# Patient Record
Sex: Female | Born: 1990 | Race: White | Hispanic: No | Marital: Single | Smoking: Former smoker
Health system: Southern US, Community
[De-identification: ages and names within clinical notes are randomized; demographics above are authoritative.]

## PROBLEM LIST (undated history)

## (undated) DIAGNOSIS — F319 Bipolar disorder, unspecified: Secondary | ICD-10-CM

## (undated) DIAGNOSIS — E109 Type 1 diabetes mellitus without complications: Secondary | ICD-10-CM

## (undated) DIAGNOSIS — F1120 Opioid dependence, uncomplicated: Secondary | ICD-10-CM

## (undated) HISTORY — DX: Type 1 diabetes mellitus without complications: E10.9

## (undated) HISTORY — DX: Opioid dependence, uncomplicated: F11.20

## (undated) HISTORY — DX: Bipolar disorder, unspecified: F31.9

---
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
## 2009-05-30 ENCOUNTER — Emergency Department (HOSPITAL_BASED_OUTPATIENT_CLINIC_OR_DEPARTMENT_OTHER): Admission: EM | Admit: 2009-05-30 | Discharge: 2009-05-30 | Payer: Self-pay | Admitting: Emergency Medicine

## 2009-09-16 ENCOUNTER — Other Ambulatory Visit: Admission: RE | Admit: 2009-09-16 | Discharge: 2009-09-16 | Payer: Self-pay | Admitting: Obstetrics and Gynecology

## 2009-10-15 ENCOUNTER — Ambulatory Visit (HOSPITAL_COMMUNITY): Admission: RE | Admit: 2009-10-15 | Discharge: 2009-10-15 | Payer: Self-pay | Admitting: Obstetrics and Gynecology

## 2009-10-15 ENCOUNTER — Encounter: Admission: RE | Admit: 2009-10-15 | Discharge: 2009-11-26 | Payer: Self-pay | Admitting: Obstetrics and Gynecology

## 2010-02-23 ENCOUNTER — Ambulatory Visit (HOSPITAL_COMMUNITY)
Admission: RE | Admit: 2010-02-23 | Discharge: 2010-02-23 | Payer: Self-pay | Source: Home / Self Care | Attending: Obstetrics and Gynecology | Admitting: Obstetrics and Gynecology

## 2010-03-25 ENCOUNTER — Inpatient Hospital Stay (HOSPITAL_COMMUNITY)
Admission: AD | Admit: 2010-03-25 | Discharge: 2010-03-28 | Payer: Self-pay | Source: Home / Self Care | Attending: Obstetrics and Gynecology | Admitting: Obstetrics and Gynecology

## 2010-03-26 LAB — CBC
Hemoglobin: 14.4 g/dL (ref 12.0–15.0)
MCH: 30.5 pg (ref 26.0–34.0)
MCHC: 35.5 g/dL (ref 30.0–36.0)
RBC: 4.72 MIL/uL (ref 3.87–5.11)

## 2010-03-26 LAB — RPR: RPR Ser Ql: NONREACTIVE

## 2010-03-26 LAB — GLUCOSE, CAPILLARY
Glucose-Capillary: 67 mg/dL — ABNORMAL LOW (ref 70–99)
Glucose-Capillary: 101 mg/dL — ABNORMAL HIGH (ref 70–99)
Glucose-Capillary: 95 mg/dL (ref 70–99)

## 2010-03-27 LAB — CBC
HCT: 30.3 % — ABNORMAL LOW (ref 36.0–46.0)
Hemoglobin: 10.6 g/dL — ABNORMAL LOW (ref 12.0–15.0)
MCH: 30.5 pg (ref 26.0–34.0)
MCHC: 35 g/dL (ref 30.0–36.0)
MCV: 87.1 fL (ref 78.0–100.0)
Platelets: 105 10*3/uL — ABNORMAL LOW (ref 150–400)
RDW: 12.2 % (ref 11.5–15.5)

## 2010-03-27 LAB — GLUCOSE, CAPILLARY
Glucose-Capillary: 31 mg/dL — CL (ref 70–99)
Glucose-Capillary: 120 mg/dL — ABNORMAL HIGH (ref 70–99)
Glucose-Capillary: 189 mg/dL — ABNORMAL HIGH (ref 70–99)

## 2010-03-28 LAB — GLUCOSE, CAPILLARY
Glucose-Capillary: 102 mg/dL — ABNORMAL HIGH (ref 70–99)
Glucose-Capillary: 108 mg/dL — ABNORMAL HIGH (ref 70–99)
Glucose-Capillary: 68 mg/dL — ABNORMAL LOW (ref 70–99)

## 2010-04-05 NOTE — Op Note (Signed)
NAMEALIENA, GHRIST NO.:  [id number]  MEDICAL RECORD NO.:  [id number]         PATIENT TYPE:  WINP  LOCATION:                                FACILITY:  [facility]  PHYSICIAN:  Patsy Baltimore, MD     DATE OF BIRTH:  1990-10-05  DATE OF PROCEDURE:  03/26/2010 DATE OF DISCHARGE:                              OPERATIVE REPORT   PREOPERATIVE DIAGNOSIS:  Nonreassuring fetal heart tracing, amnionitis, and failure to progress.  POSTOPERATIVE DIAGNOSIS:  Nonreassuring fetal heart tracing, amnionitis, and failure to progress.  PROCEDURE PERFORMED:  Primary low transverse cesarean section.  SURGEON:  Patsy Baltimore, MD  ASSISTANT:  Tilda Burrow, MD  ANESTHESIA:  Epidural.  FINDINGS:  Occiput posterior position, full-term live female child weighing 7 pounds.  SPECIMEN:  Placenta.  ESTIMATED BLOOD LOSS:  900 mL.  COMPLICATIONS:  None.  Informed consent was obtained from the patient, and she was transferred to the operating room.  She had an epidural which had been placed during labor and this was bolused for the cesarean section.  She was placed in the dorsal supine position with a leftward tilt.  She was prepped and draped in the usual sterile fashion.  A Foley catheter was already in place.  A time-out was called and we began the procedure.  A Pfannenstiel skin incision was made and carried down to the underlying fascia with the Bovie.  The Bovie was nicked in the midline, and she was extended bilaterally.  The superior aspect of the fascial flap was grasped on either side with two Kocher clamps and the underlying rectus muscle was dissected off.  The same was done inferiorly down to the pubic bone.  The rectus muscle was then divided in the midline and the peritoneum was entered by blunt separation.  The peritoneal entry was stretched superiorly to avoid the bladder.  The bands of peritoneum were then taken down laterally to increase the peritoneal entry.  An  Alexis O retractor was then put in place.  The bladder blade was created at the vesicouterine reflection with the Metzenbaum scissors and developed digitally.  The bladder was then pushed down with the bladder blade. The uterine incision was made with the scalpel.  The infant's head was then delivered atraumatically, and the rest of the body with fundal pressure.  The nose and mouth were suctioned.  Cord was clamped and cut and the baby was handed off to the waiting pediatrician.  The patient was a cord blood donor, so the cord blood collection was done.  The placenta was then delivered by manual extraction.  The uterus was cleared of all clots and debris.  The uterine closure was done using 0 chromic in a single layer.  Reinforcement figure-of-eight stitches were placed to accomplish hemostasis.  Once hemostasis was achieved, the Alexis O retractor was then removed.  The paracolic gutters were cleared of blood clots.  The peritoneum was closed with 0 Vicryl and the fascia was closed with 0 Vicryl in a running continuous fashion.  The skin was closed with staples.  The patient tolerated the procedure well. The sponge, needle,  and instrument counts were correct x2.  She was transferred to the PACU in stable condition.          ______________________________ Patsy Baltimore, MD     CO/MEDQ  D:  04/03/2010  T:  04/04/2010  Job:  [id number]  Electronically Signed by Patsy Baltimore MD on 04/05/2010 02:24:20 PM

## 2010-04-25 ENCOUNTER — Emergency Department (HOSPITAL_BASED_OUTPATIENT_CLINIC_OR_DEPARTMENT_OTHER)
Admission: EM | Admit: 2010-04-25 | Discharge: 2010-04-25 | Disposition: A | Discharge status: Home / Self Care | Payer: Managed Care, Other (non HMO) | Attending: Emergency Medicine | Admitting: Emergency Medicine

## 2010-04-25 DIAGNOSIS — Z794 Long term (current) use of insulin: Secondary | ICD-10-CM | POA: Insufficient documentation

## 2010-04-25 DIAGNOSIS — F319 Bipolar disorder, unspecified: Secondary | ICD-10-CM | POA: Insufficient documentation

## 2010-04-25 DIAGNOSIS — N390 Urinary tract infection, site not specified: Secondary | ICD-10-CM | POA: Insufficient documentation

## 2010-04-25 DIAGNOSIS — E109 Type 1 diabetes mellitus without complications: Secondary | ICD-10-CM | POA: Insufficient documentation

## 2010-04-25 DIAGNOSIS — R197 Diarrhea, unspecified: Secondary | ICD-10-CM | POA: Insufficient documentation

## 2010-04-25 DIAGNOSIS — R112 Nausea with vomiting, unspecified: Secondary | ICD-10-CM | POA: Insufficient documentation

## 2010-04-25 DIAGNOSIS — J45909 Unspecified asthma, uncomplicated: Secondary | ICD-10-CM | POA: Insufficient documentation

## 2010-04-25 LAB — URINE MICROSCOPIC-ADD ON

## 2010-04-25 LAB — CBC
MCHC: 34.7 g/dL (ref 30.0–36.0)
Platelets: 196 10*3/uL (ref 150–400)
RDW: 11.7 % (ref 11.5–15.5)
WBC: 12.3 10*3/uL — ABNORMAL HIGH (ref 4.0–10.5)

## 2010-04-25 LAB — COMPREHENSIVE METABOLIC PANEL
ALT: 18 U/L (ref 0–35)
Albumin: 4.6 g/dL (ref 3.5–5.2)
Alkaline Phosphatase: 106 U/L (ref 39–117)
BUN: 12 mg/dL (ref 6–23)
Calcium: 9.6 mg/dL (ref 8.4–10.5)
Potassium: 3.2 mEq/L — ABNORMAL LOW (ref 3.5–5.1)
Sodium: 147 mEq/L — ABNORMAL HIGH (ref 135–145)
Total Protein: 8 g/dL (ref 6.0–8.3)

## 2010-04-25 LAB — URINALYSIS, ROUTINE W REFLEX MICROSCOPIC
Specific Gravity, Urine: 1.017 (ref 1.005–1.030)
Urine Glucose, Fasting: NEGATIVE mg/dL
pH: 5.5 (ref 5.0–8.0)

## 2010-04-25 LAB — DIFFERENTIAL
Basophils Absolute: 0 10*3/uL (ref 0.0–0.1)
Eosinophils Absolute: 0.1 10*3/uL (ref 0.0–0.7)
Eosinophils Relative: 1 % (ref 0–5)

## 2010-04-27 LAB — URINE CULTURE
Colony Count: NO GROWTH
Culture: NO GROWTH

## 2010-04-29 ENCOUNTER — Emergency Department (HOSPITAL_BASED_OUTPATIENT_CLINIC_OR_DEPARTMENT_OTHER)
Admission: EM | Admit: 2010-04-29 | Discharge: 2010-04-29 | Disposition: A | Discharge status: Home / Self Care | Payer: Managed Care, Other (non HMO) | Attending: Emergency Medicine | Admitting: Emergency Medicine

## 2010-04-29 ENCOUNTER — Emergency Department (INDEPENDENT_AMBULATORY_CARE_PROVIDER_SITE_OTHER): Payer: Managed Care, Other (non HMO)

## 2010-04-29 DIAGNOSIS — R1032 Left lower quadrant pain: Secondary | ICD-10-CM | POA: Insufficient documentation

## 2010-04-29 DIAGNOSIS — E109 Type 1 diabetes mellitus without complications: Secondary | ICD-10-CM | POA: Insufficient documentation

## 2010-04-29 DIAGNOSIS — F319 Bipolar disorder, unspecified: Secondary | ICD-10-CM | POA: Insufficient documentation

## 2010-04-29 DIAGNOSIS — K5289 Other specified noninfective gastroenteritis and colitis: Secondary | ICD-10-CM | POA: Insufficient documentation

## 2010-04-29 DIAGNOSIS — R112 Nausea with vomiting, unspecified: Secondary | ICD-10-CM

## 2010-04-29 LAB — URINE MICROSCOPIC-ADD ON

## 2010-04-29 LAB — BASIC METABOLIC PANEL
Calcium: 8.8 mg/dL (ref 8.4–10.5)
GFR calc non Af Amer: >60 mL/min (ref >60)
Glucose, Bld: 209 mg/dL — ABNORMAL HIGH (ref 70–99)
Potassium: 4.3 mEq/L (ref 3.5–5.1)
Sodium: 142 mEq/L (ref 135–145)

## 2010-04-29 LAB — URINALYSIS, ROUTINE W REFLEX MICROSCOPIC
Bilirubin Urine: NEGATIVE
Ketones, ur: 40 mg/dL — AB
Nitrite: NEGATIVE
Protein, ur: NEGATIVE mg/dL
Specific Gravity, Urine: 1.022 (ref 1.005–1.030)
Urobilinogen, UA: 0.2 mg/dL (ref 0.0–1.0)

## 2010-04-29 LAB — CBC
MCHC: 34.6 g/dL (ref 30.0–36.0)
Platelets: 201 10*3/uL (ref 150–400)
RDW: 11.3 % — ABNORMAL LOW (ref 11.5–15.5)
WBC: 10.5 10*3/uL (ref 4.0–10.5)

## 2010-04-29 LAB — DIFFERENTIAL
Basophils Absolute: 0 10*3/uL (ref 0.0–0.1)
Eosinophils Absolute: 0.1 10*3/uL (ref 0.0–0.7)
Lymphs Abs: 0.8 10*3/uL (ref 0.7–4.0)
Monocytes Absolute: 0.6 10*3/uL (ref 0.1–1.0)
Neutrophils Relative %: 85 % — ABNORMAL HIGH (ref 43–77)

## 2010-04-29 LAB — WET PREP, GENITAL
Clue Cells Wet Prep HPF POC: NONE SEEN
Trich, Wet Prep: NONE SEEN
Yeast Wet Prep HPF POC: NONE SEEN

## 2010-04-29 MED ORDER — IOHEXOL 300 MG/ML  SOLN
100.0000 mL | Freq: Once | INTRAMUSCULAR | Status: AC | PRN
  Administered 2010-04-29: 100 mL via INTRAVENOUS

## 2010-04-30 LAB — GC/CHLAMYDIA PROBE AMP, GENITAL: GC Probe Amp, Genital: NEGATIVE

## 2010-05-27 NOTE — Discharge Summary (Signed)
Katherine Ray, MUMPOWER              ACCOUNT NO.:  [id number]  MEDICAL RECORD NO.:  [id number]           PATIENT TYPE:  LOCATION:                                 FACILITY:  PHYSICIAN:  Patsy Baltimore, MD     DATE OF BIRTH:  October 07, 1990  DATE OF ADMISSION: DATE OF DISCHARGE:                              DISCHARGE SUMMARY   REASON FOR ADMISSION:  Rupture of membranes at 38 weeks and 3 days gestation.  PRINCIPAL PROCEDURE PERFORMED:  A primary low transverse cesarean section.  OTHER DIAGNOSES: 1. Insulin dependent diabetes mellitus. 2. GBS positive. 3. Bipolar disorder. 4. Low-grade squamous intraepithelial lesion Pap. 5. Asthma.  Ms. Katherine Ray is a 20 year old gravida 1, para 0 admitted at 88 weeks and 3 days gestation with a complaint of leakage of clear fluid. She was deemed to be ruptured and admitted for induction of labor.  Her pregnancy had been complicated by her insulin dependent diabetes.  She was maintained by [facility] on her insulin pump.  Her fetal echo was normal.  Her blood sugars were fairly controlled during her pregnancy.  She also was GBS positive.  She has a history of bipolar disorder.  She was on Lamictal prepregnancy but she stopped during the pregnancy and remained stable.  She also had a low-grade SIL Pap and also underwent capacity during her pregnancy.  She also has a history of mild asthma.  Upon admission to the hospital, her vital signs were stable.  The fetal heart tracing was reassuring.  She was not contracting regularly.  Therefore, she was started on Pitocin for induction of labor and augmentation.  Her blood sugars were controlled during the pregnancy using her insulin pump and fingersticks.  During labor, her blood sugars ranged between 69 and 127.  She received an epidural in labor and was comfortable.  Her platelet count was noted to be 100.  Her hematocrit was 40.6.  She progressed to full dilation and effacement and  she pushed for about an hour.  During this time, it was noted that the fetal heart rate baseline had elevated to 170s and 180s, still remained moderate variability.  Her temperature was 99.7.  Given the persistent elevation of the fetal heart rate baseline and no significant progress with her pushing effort and her borderline chorioamnionitis.  The patient was given the option of either continuing to push with potential risks to the baby versus proceeding with cesarean section, she opted for the cesarean section and we went on to do a primary low transverse cesarean section.  The baby was found to be in occiput posterior position.  She had a full-term live female child weighing 7 pounds.  Estimated blood loss was 900 mL.  There were no complications during the surgery.  Postop day #1, she did well.  She was ambulating and voiding without difficulty.  She had no issues.  Her vital signs were stable and she was afebrile.  Her incision was clean, dry, and intact.  There was no surrounding erythema.  Her insulin pump was readjusted based on prior instructions given from [facility].  She was seen  by the social worker for discharge planning and on postop day #2 she was discharged home with prescriptions for Percocet and Motrin.  She was told to follow up in the office for staple removal and a wound check.  She will continue to follow up with [facility] for her diabetes management.  The patient verbalized an understanding of the discharge instructions and was discharged home with her baby.  There were no active issues at the time of discharge.          ______________________________ Patsy Baltimore, MD     CO/MEDQ  D:  05/25/2010  T:  05/26/2010  Job:  [id number]  Electronically Signed by Patsy Baltimore MD on 05/27/2010 12:31:29 PM

## 2010-06-08 ENCOUNTER — Other Ambulatory Visit (HOSPITAL_COMMUNITY)
Admission: RE | Admit: 2010-06-08 | Discharge: 2010-06-08 | Disposition: A | Discharge status: Home / Self Care | Payer: Managed Care, Other (non HMO) | Source: Ambulatory Visit | Attending: Family Medicine | Admitting: Family Medicine

## 2010-06-08 ENCOUNTER — Other Ambulatory Visit: Payer: Self-pay | Admitting: Physician Assistant

## 2010-06-08 DIAGNOSIS — Z124 Encounter for screening for malignant neoplasm of cervix: Secondary | ICD-10-CM | POA: Insufficient documentation

## 2010-07-13 NOTE — H&P (Signed)
Katherine Ray, ZAUN NO.:  [id number]   MEDICAL RECORD NO.:  [id number]          PATIENT TYPE:  EMS   LOCATION:  ED                           FACILITY:  [facility]   PHYSICIAN:  Geoffery Lyons, M.D.      DATE OF BIRTH:  1990/03/07   DATE OF ADMISSION:  08/01/2007  DATE OF DISCHARGE:  08/01/2007                       PSYCHIATRIC ADMISSION ASSESSMENT   This is a 20 year old female who was voluntarily admitted on August 01, 2007.   HISTORY OF PRESENT ILLNESS:  The patient presents with a history of  intentional overdose, overdosing on fifteen 10 mg tablets of Lexapro and  also took Sleep MD 29 tablets on Monday at 3:00 p.m.  She reporting  increasing depression because she feels alone and having difficulty  fitting in with her family, feeling worthless.  She also reports some  recent drinking with the last drink on her birth day.  She denies any  hallucinations and denies any mood fluctuation.   PAST PSYCHIATRIC HISTORY:  First admission to the [facility].  Reports a history of depression.  Sees Dr. Roland Earl in [place]  with the last visit 1 week ago.  She has been on Zoloft which she states  is not working.   SOCIAL HISTORY:  She is a 20 year old single female, has no children.  She lives her parents and her twin sister.  She was hoping to go to  [employer] school.  She states her mother is a Charity fundraiser.   FAMILY HISTORY:  Grandmother with bipolar disorder.   ALCOHOL AND DRUGS:  Patient is a nonsmoker.  Has used marijuana in the  past and reports some recent drinking.  Primary care Mavis Gravelle is Dr.  Hinda Glatter in [place] at [facility] who is her endocrinologist.   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   MEDICATIONS:  1. Has been on Lexapro since March.  2. Humalog with dosing varying according to carbohydrate intake.  3. Vivance 30 mg daily.  4. Symbicort 1 inhalation  daily.  5. Albuterol inhaler p.r.n.   DRUG ALLERGIES:  No known allergies.   PHYSICAL EXAMINATION:  GENERAL:  This is a young female, very  cooperative.  She was fully assessed at [facility] emergency department.  Poison Control was notified.  VITAL SIGNS:  Temperature is 97, 75 heart rate, 20 respirations, blood  pressure is 106/75.  She is 137 pounds, 5 feet 4-1/2 inches tall.   LABORATORY DATA:  Bilirubin of 0.1.  Salicylate level less than 4,  glucose of 163.  Alcohol level less than 5.  Acetaminophen level less  than 10.  Urine drug screen is negative.  Pregnancy test is negative.  Urine glucose is greater than 1000, hemoglobin of 16.9 with hematocrit  of 47.7.   MENTAL STATUS EXAM:  Again, this is a fully alert, cooperative female  currently wearing scrubs, good eye contact.  Speech is clear, normal  pace and tone.  The patient's mood is depressed and anxious.  The  patient does appear anxious  as well.  Her thought processes are  coherent, goal directed.  No evidence of any delusional thinking.  Denies any current suicidal thoughts.  Cognitive function intact.  Her  memory is good.  Judgment and insight is fair.  She appears sincere.   AXIS I:  Mood disorder.  AXIS II:  Deferred.  AXIS III:  Juvenile diabetes, asthma and status post Lexapro overdose.  AXIS IV:  Psychosocial problems.  Medical problems.  AXIS V:  Current is 30.   PLAN:  Contract for safety.  Stabilize mood and thinking.  Insulin pump  was removed on the unit on admission due the safety purposes.  We will  monitor blood sugars very closely.  The patient's blood sugar at this  time is elevated.  The emergency department was contacted for the  patient to possibly be admitted for IV fluids and being regulated with  IV insulin.  We did contact the diabetic nurse, who came over and  assessed the patient for recommendations, who will also recommend the  patient be transferred for assessment of her elevated blood  sugar.   FAMILY SESSION:  We will have a family session with the patient's mom  for support and concerns.  The patient may benefit from a change in her  antidepressant and will be encouraged to follow up with her ongoing  medical problems.  Her tentative length of stay at this time is 4 to 6  days.      Landry Corporal, N.P.      Geoffery Lyons, M.D.  Electronically Signed    JO/MEDQ  D:  08/08/2007  T:  08/08/2007  Job:  [id number]

## 2010-07-13 NOTE — Discharge Summary (Signed)
Katherine Ray, Katherine Ray NO.:  [id number]   MEDICAL RECORD NO.:  [id number]          PATIENT TYPE:  INP   LOCATION:  2917                         FACILITY:  [facility]   PHYSICIAN:  Eduard Clos, MDDATE OF BIRTH:  Apr 05, 1990   DATE OF ADMISSION:  08/17/2007  DATE OF DISCHARGE:                               DISCHARGE SUMMARY   COURSE IN THE HOSPITAL:  A 20 year old female with known history of  depression and diabetes mellitus type 1 was brought into the ER by her  family after the patient's family witnessed the patient taking an  overdose of multiple pills, which included Geodon, Remeron, Tylenol,  Vyvanse, amoxicillin.  The patient on admission was given charcoal and  admitted to a monitored unit.  On admission the patient had a Tylenol  level of 100.3, which increased over 3 hours to 126.8.  Urine drug  screen was positive for amphetamines.  The patient's AST, ALT, alkaline  phosphatase remained normal all through the admission.  PT/INR was  normal also all throughout the admission.  The patient was started on IV  acetylcysteine and Poison Control was contacted.  The patient was  started on IV fluids and monitored in the ICU unit.  Subsequently the  patient's Tylenol level started decreasing.  It was 38.4 and further  testing showed level less than 10.  The patient's urine drug screen was  positive for amphetamines.  The patient's condition gradually improved  medically but the patient did get agitated, for which she had to be  given IV Haldol and placed on restraint and placed on involuntary  commitment.  Psychiatry was consulted and per psychiatry the patient is  to be transferred when the patient is medically stable.  At this time  the patient is medically stable but the patient is still depressed and  has suicidal thoughts.  She will be transferred to [facility]  when a bed available.  The patient's family is aware of her condition.  As the patient has  an insulin pump, her basal dose was continued but  bolus doses were calculated and drawn by the nurse and given to the  patient to administer.   FINAL DIAGNOSES:  1. Status post multidrug overdose, including Tylenol, with suicidal      ideation.  2. Major depression.  3. Diabetes mellitus type 1.   MEDICATION AT TRANSFER:  1. The patient is on insulin pump, to follow insulin pump standing      orders with a basal rate to continue as it is now and bolus doses      to be calculated and drawn by the nurse and patient to give after      the patient's suicidal ideation is resolved and when cleared by      psychiatrist.  2. Further medication per psychiatrist.   PLAN:  The patient is to be transferred to [facility] when  bed available.      Eduard Clos, MD  Electronically Signed     ANK/MEDQ  D:  08/20/2007  T:  08/20/2007  Job:  [id number]
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
## 2010-07-13 NOTE — Consult Note (Signed)
[name], [name] NO.:  [id number]   MEDICAL RECORD NO.:  [id number]          PATIENT TYPE:  IPS   LOCATION:  0305                          FACILITY:  [facility]   PHYSICIAN:  Ramiro Harvest, MD    DATE OF BIRTH:  September 04, 1990   DATE OF CONSULTATION:  DATE OF DISCHARGE:                                 CONSULTATION   REFERRING PHYSICIAN:  This is a consult for Behavioral Health per Dr.  Dub Mikes.   ENDOCRINOLOGIST:  Dr. Brynda Peon in [place].   PRIMARY CARE PHYSICIAN:  Dr. Clyda Greener of [place], [place].   REASON FOR CONSULTATION:  Diabetic management.   HISTORY OF PRESENT ILLNESS:  Katherine Ray is a 20 year old female with  a history of type 1 diabetes on an insulin pump, history of depression,  anxiety, ADHD, prior suicidal attempts who was admitted on August 17, 2007  secondary to a suicide attempt with multiple drug overdoses of Tylenol,  Geodon, amoxicillin and Remeron.  The patient was medically stabilized,  and on August 20, 2007, the patient was transferred to [facility]  as an involuntary commitment per psychiatry recommendations.  On  transfer to Behavioral Health per policy, the patient's insulin pump was  discontinued, and the patient was placed on Lantus, sliding scale  insulin and insulin meal coverage.  The patient's CBGs have been greater  than 500 and were consulted for management of her diabetes.  The patient  denies any fevers, no chills, no nausea, no vomiting, no abdominal pain,  no dysuria, no hematuria, no focal neurological symptoms, no chest pain,  no shortness of breath, no wheezing, no cough, no other associated  symptoms.  The patient is anxious appearing.   ALLERGIES:  No known drug allergies.   PAST MEDICAL HISTORY:  1. Type 1 diabetes on insulin pump since age 38 when he was diagnosed.  2. Asthma.  3. Depression.  4. Suicidal attempts previously in the past; the last one was on August 17, 2007.  5. ADHD.  6.  Anxiety.   MEDICATIONS IN HOSPITAL:  1. Symbicort one puff daily.  2. Lantus 30 units subcutaneous q.h.s.  3. NovoLog sliding scale insulin.  4. Vyvanse 30 mg daily.  5. Remeron 45 mg daily.  6. Nicoderm 21 mg patch daily.  7. Geodon 60 mg daily.   HOME MEDICATIONS:  1. Symbicort.  2. Albuterol p.r.n.  3. Vyvanse 30 mg daily.  4. Humalog sliding scale insulin, insulin pump with basal rate at 1.3      units per hour from 8 a.m. to 8 p.m. and 1.4 units per hour 8 p.m.      to 8 a.m. with boluses ranging anywhere from 1-6 units per meal.      The patient is a 1:10 carbohydrate ratio diet.  5. Geodon.  6. Remeron.   REVIEW OF SYSTEMS:  As per HPI; otherwise negative.   SOCIAL HISTORY:  The patient lives in [place] with parents and a  grandmother.  Positive tobacco use.  Occasional alcohol use.  Prior use  of marijuana.  No IV  drug use.  No children.   FAMILY HISTORY:  Father alive at age 33 with hyperlipidemia.  Mother  alive at age 6 and healthy.  The patient has 2 sisters, one 20 who has  a history of asthma, and a 70 year old twin sister with allergies.  Also  a family history of bipolar disorder.   PHYSICAL EXAMINATION:  VITAL SIGNS:  Temperature 97.0, respiratory rate  20.  CBGs ranged from 154 through 510.  GENERAL:  The patient is anxious appearing, in no apparent distress.  HEENT:  Normocephalic and atraumatic.  Pupils equal, round and reactive  to light.  Extraocular movements intact.  Oropharynx is clear.  No  lesions, no exudates.  NECK:  Supple.  No lymphadenopathy.  RESPIRATORY:  Lungs are clear to auscultation bilaterally.  No wheezes,  no rhonchi, no crackles.  CARDIOVASCULAR:  Tachycardic, regular rhythm.  No murmurs, rubs, or  gallops.  ABDOMEN:  Soft, nontender, nondistended.  Positive bowel sounds.  No  rebound, no guarding.  EXTREMITIES:  No clubbing, cyanosis, or edema.  NEUROLOGIC:  The patient is alert and oriented x3.  Cranial nerves II-  XII  are grossly intact.  No focal deficits.   LABORATORY DATA:  CBG 510.  CMET from August 20, 2007 - sodium 141,  potassium 4.0, chloride 108, bicarbonate 28, BUN 6, creatinine 0.58,  glucose of 175, calcium of 8.4, albumin 3.1, bilirubin 0.9, alkaline  phosphatase 55, AST 12, ALT 17, protein 5.2.  CBC from August 19, 2007  shows a white count of 5.7, hemoglobin 13.0, platelets 166, hematocrit  37.4.  Hemoglobin A1c from August 17, 2007 is 12.9.   ASSESSMENT AND PLAN:  Katherine Ray is a 20 year old white female with  a history of type I diabetes since age 1 on an insulin pump who was  admitted to Behavioral Health secondary to a suicidal attempt.  We were  asked to manage the patient's diabetes.   PROBLEM:  1. Uncontrolled type 1 diabetes.  Last hemoglobin A1c of 12.9 on August 17, 2007.  Per policy, insulin pump was discontinued.  Will      increase the patient's basal Lantus to 34 units q.h.s.  Will check      a basic metabolic profile.  Will continue sliding scale insulin and      titrate Lantus accordingly.  2. Suicide attempt per primary team.  3. Depression per primary team.  4. Asthma.  Continue increased dose of Symbicort to 2 puffs twice      daily and albuterol as needed.  5. Anxiety per primary team.  6. Attention deficit hyperactivity disorder.  Continue current regimen      of Vyvanse.   It was a pleasure taking care of Ms. Nordstrom.      Ramiro Harvest, MD  Electronically Signed     DT/MEDQ  D:  08/22/2007  T:  08/22/2007  Job:  [id number]   cc:   Geoffery Lyons, M.D.   Dr. Chong Sicilian   Clyda Greener, M.D.  [place], [place]

## 2010-07-13 NOTE — H&P (Signed)
Katherine Ray, BRUNKE NO.:  [id number]   MEDICAL RECORD NO.:  [id number]          PATIENT TYPE:  INP   LOCATION:  1823                         FACILITY:  [facility]   PHYSICIAN:  Della Goo, M.D. DATE OF BIRTH:  December 01, 1990   DATE OF ADMISSION:  08/17/2007  DATE OF DISCHARGE:                              HISTORY & PHYSICAL   PRIMARY CARE PHYSICIAN:  Unassigned.   CHIEF COMPLAINT:  Overdose.   HISTORY OF PRESENT ILLNESS:  This is a 20 year old female who was  brought to the emergency department emergently by her family; her  grandmother and sister after she was witnessed taking an overdose of  multiple pills, which included Geodon, Remeron, Tylenol, Vyvanse,  dimenhydrinate and amoxicillin.  The patient at the time of examination  reports that she was trying to kill herself.  She was also recently  hospitalized at [facility] for a similar attempt recently.  The  patient reports feeling depressed and having a long history of  depression.  Some of the medications that patient took she reports were  not her medications, they were her sister's.  The tablets were taken 15  minutes prior to arrival at the emergency department.   In the emergency department, a pill count was taken and it was estimated  that the patient took Vyvanse 30 mg tablets; 186 tabs.  Amoxicillin 875  mg; 12 tablets.  The Geodon tablets were 60 mg, and 7 tablets were  taken.  The Remeron was 45 mg tablets, and she had taken 1 tablet.  Dimenhydrinate tablet was a  K200, and there were 3 tablets taken.  The  Tylenol dose was 41 tablets taken.  In the emergency department, the  patient was evaluated and administered activated charcoal and placed on  a monitor for cardiac monitoring and was found to be tachycardiac with a  heart rate from 143-156 and in a sinus tachycardia.   The patient reports feeling depressed, alone and not safe.  She would  not describe what she meant by not safe.  She  was recently hospitalized  August 01, 2007, for similar reasons.  At that time, she voluntarily  committed herself.   PAST MEDICAL HISTORY:  1. Significant for type 1 diabetes mellitus since age 55.  2. History of asthma   PAST SURGICAL HISTORY:  History of an insulin pump placement.   MEDICATIONS:  1. Humalog insulin.  2. Lexapro.  3. Vyvanse.  4. Symbicort inhaler and albuterol inhaler p.r.n.Marland Kitchen   ALLERGIES:  NO KNOWN DRUG ALLERGIES   SOCIAL HISTORY:  The patient reports smoking one pack of cigarettes  daily.  She reports not drinking or smoking any marijuana since her  birth date on [birth year].   FAMILY HISTORY:  Positive for bipolar disorder in her maternal  grandmother.   REVIEW OF SYSTEMS:  Pertinents as mentioned above.   PHYSICAL EXAMINATION:  GENERAL:  This is a 20 year old, well-nourished,  well-developed thin female in acute distress.  VITAL SIGNS:  Temperature  98.0, blood pressure 158/98, heart rate 156, respirations 20.  O2  saturation is 99% on room air.  HEENT:  Normocephalic, atraumatic.  Pupils are sluggish but reactive to  light and dilated.  Extraocular movements are intact.  Funduscopic  benign.  Oropharynx is clear.  Mucosa is black from the charcoal, so are  her lips.  NECK:  Supple, full range of motion.  No thyromegaly, adenopathy or  jugulovenous distention.  CARDIOVASCULAR:  Tachycardiac rate and rhythm.  No murmurs, gallops or rubs.  LUNGS:  Clear to auscultation bilaterally.  ABDOMEN:  Positive bowel sounds, soft, nontender, nondistended.  EXTREMITIES:  Without cyanosis, clubbing or edema.  NEUROLOGIC:  Nonfocal.   LABORATORY STUDIES:  White blood cell count 8.4, hemoglobin 15.8,  hematocrit 44.3, platelets 202, neutrophils 74%, lymphocytes 19%.  Sodium 131, potassium 3.4, chloride 93, bicarb 25, BUN 19, creatinine  0.78 and glucose 314.  Albumin 4.4.  Salicylates less than 4.0.  Alcohol  level less than 5.  Acetaminophen level at 1:00 a.m. was  100.3, 0400  a.m., the level was 126.8.  Urinalysis; glucose greater than 1000.  Urine HCG negative.  Urine drug screen positive for amphetamines.  Alcohol level less than 5.   ASSESSMENT:  A 20 year old female being admitted with:  1. Polysubstance overdose and Tylenol toxicity.  2. Suicide attempt.  3. Tachycardia.  4. Hyperglycemia.  5. Mild hypokalemia.  6. Mild hyponatremia.   PLAN:  The patient will be admitted to the step-down ICU area for  monitoring.  A one-to-one sitter has been ordered for the patient's  safety and suicide precautions.  The patient will be placed on clear  liquids and IV fluids have been ordered for maintenance and fluid  resuscitation.  IV Mucomyst has been ordered to treat the elevated  Tylenol level.  This will be monitored and managed by pharmacy.  DVT and  GI prophylaxis have been ordered as well.  Once the patient is medically  stable, the patient will be evaluated by psychiatry for further  treatment options.      Della Goo, M.D.  Electronically Signed     HJ/MEDQ  D:  08/17/2007  T:  08/17/2007  Job:  [id number]
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
## 2010-11-25 LAB — COMPREHENSIVE METABOLIC PANEL
ALT: 17
ALT: 20
ALT: 20
ALT: 30
AST: 20
AST: 12
Albumin: 4.6
Albumin: 3.2 — ABNORMAL LOW
Albumin: 4.4
Alkaline Phosphatase: 66
Alkaline Phosphatase: 77
Alkaline Phosphatase: 97
BUN: 11
BUN: 12
CO2: 24
CO2: 28
Calcium: 11.4 — ABNORMAL HIGH
Calcium: 7.9 — ABNORMAL LOW
Calcium: 8.3 — ABNORMAL LOW
Calcium: 8.4
Calcium: 8.5
Chloride: 108
Creatinine, Ser: 0.56
Creatinine, Ser: 0.52
Creatinine, Ser: 0.63
GFR calc Af Amer: >60
GFR calc Af Amer: >60
GFR calc non Af Amer: >60
GFR calc non Af Amer: >60
Glucose, Bld: 115 — ABNORMAL HIGH
Glucose, Bld: 140 — ABNORMAL HIGH
Glucose, Bld: 198 — ABNORMAL HIGH
Glucose, Bld: 292 — ABNORMAL HIGH
Glucose, Bld: 314 — ABNORMAL HIGH
Potassium: 3.2 — ABNORMAL LOW
Potassium: 3.4 — ABNORMAL LOW
Potassium: 3.9
Sodium: 131 — ABNORMAL LOW
Sodium: 139
Sodium: 140
Sodium: 141
Total Bilirubin: 1.1
Total Protein: 7.1
Total Protein: 5.1 — ABNORMAL LOW
Total Protein: 5.3 — ABNORMAL LOW
Total Protein: 6.2
Total Protein: 7

## 2010-11-25 LAB — RAPID URINE DRUG SCREEN, HOSP PERFORMED
Amphetamines: NOT DETECTED
Barbiturates: NOT DETECTED
Benzodiazepines: NOT DETECTED
Tetrahydrocannabinol: NOT DETECTED

## 2010-11-25 LAB — CBC
HCT: 47.7 — ABNORMAL HIGH
HCT: 37.4
Hemoglobin: 13
Hemoglobin: 15.8 — ABNORMAL HIGH
MCV: 87.8
Platelets: 205
RDW: 12.2
RDW: 12.1
WBC: 6.5
WBC: 4.7

## 2010-11-25 LAB — DIFFERENTIAL
Basophils Absolute: 0
Basophils Relative: 0
Eosinophils Absolute: 0
Eosinophils Relative: 1
Eosinophils Relative: 0
Lymphocytes Relative: 35
Lymphs Abs: 2.3
Monocytes Absolute: 0.5
Monocytes Absolute: 0.6
Monocytes Relative: 8
Monocytes Relative: 7
Neutro Abs: 3.6
Neutrophils Relative %: 74

## 2010-11-25 LAB — URINE MICROSCOPIC-ADD ON

## 2010-11-25 LAB — POCT I-STAT, CHEM 8
Creatinine, Ser: 0.8
Glucose, Bld: 454 — ABNORMAL HIGH
HCT: 44
Hemoglobin: 15
Potassium: 3.9
Sodium: 134 — ABNORMAL LOW
TCO2: 20

## 2010-11-25 LAB — PROTIME-INR
INR: 1.1
INR: 1.1
Prothrombin Time: 13.9
Prothrombin Time: 14.2

## 2010-11-25 LAB — BASIC METABOLIC PANEL
BUN: 18
CO2: 28
Calcium: 10.2
GFR calc non Af Amer: >60
Glucose, Bld: 494 — ABNORMAL HIGH
Glucose, Bld: 508
Potassium: 4.8
Sodium: 134 — ABNORMAL LOW
Sodium: 137

## 2010-11-25 LAB — GLUCOSE, RANDOM
Glucose, Bld: 510
Glucose, Bld: 550

## 2010-11-25 LAB — URINALYSIS, ROUTINE W REFLEX MICROSCOPIC
Bilirubin Urine: NEGATIVE
Glucose, UA: >1000 — AB
Hgb urine dipstick: NEGATIVE
Nitrite: NEGATIVE
Specific Gravity, Urine: 1.044 — ABNORMAL HIGH
Specific Gravity, Urine: 1.039 — ABNORMAL HIGH
Urobilinogen, UA: 0.2
pH: 7.5
pH: 6

## 2010-11-25 LAB — HEMOGLOBIN A1C
Hgb A1c MFr Bld: 12.9 — ABNORMAL HIGH
Mean Plasma Glucose: 382

## 2010-11-25 LAB — BILIRUBIN, DIRECT: Bilirubin, Direct: 0.1

## 2010-11-25 LAB — ACETAMINOPHEN LEVEL
Acetaminophen (Tylenol), Serum: 100.3 — ABNORMAL HIGH
Acetaminophen (Tylenol), Serum: 126.9 — ABNORMAL HIGH
Acetaminophen (Tylenol), Serum: 127.4 — ABNORMAL HIGH
Acetaminophen (Tylenol), Serum: <10 — ABNORMAL LOW

## 2010-11-25 LAB — POCT PREGNANCY, URINE
Operator id: 161631
Preg Test, Ur: NEGATIVE

## 2010-11-25 LAB — SALICYLATE LEVEL: Salicylate Lvl: <4

## 2010-11-25 LAB — ETHANOL
Alcohol, Ethyl (B): <5
Alcohol, Ethyl (B): <5

## 2011-06-30 IMAGING — CT CT ABD-PELV W/ CM
2 of 3 series · 16 of 46 positions shown, 18 images · IV contrast (APPLIED)
Comparison: None

CLINICAL DATA: Left lower quadrant pain.  Nausea vomiting.
Recently on antibiotics.

CT ABDOMEN AND PELVIS WITH CONTRAST
TECHNIQUE: Multidetector CT imaging of the abdomen and pelvis was
performed following the standard protocol during bolus
administration of intravenous contrast.
Contrast: 100  ml Lmnipaque-PQQ

[Series 2: abd/pelvis 5.0 b31f · axial · 0.62mm/px · z∈[+877,+1212]mm · 13 of 79 slices shown, 15 images]
[im 6/79  soft-tissue]
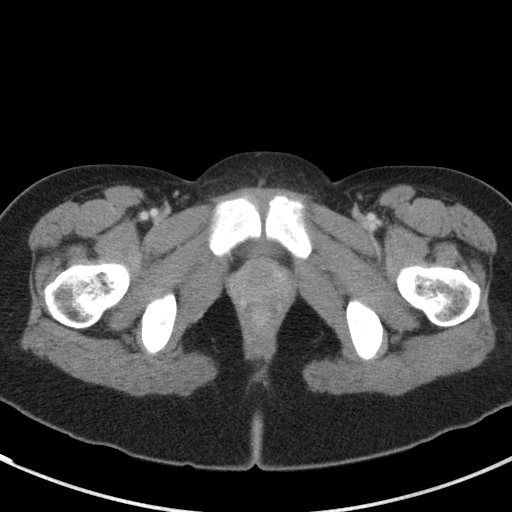
[im 6/79  bone]
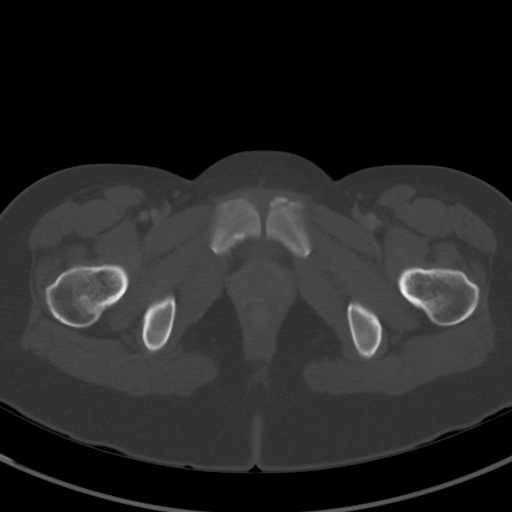
[im 11/79  soft-tissue]
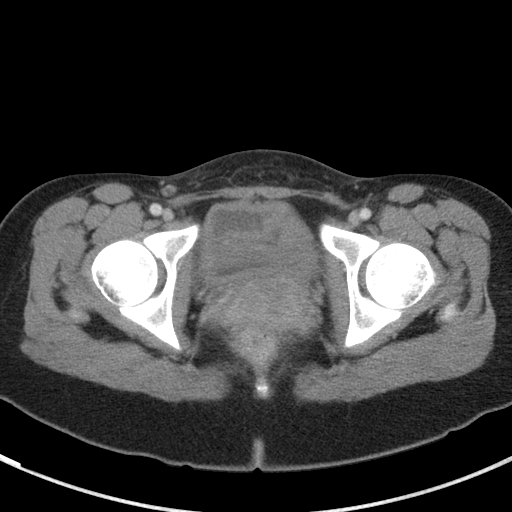
[im 16/79  soft-tissue]
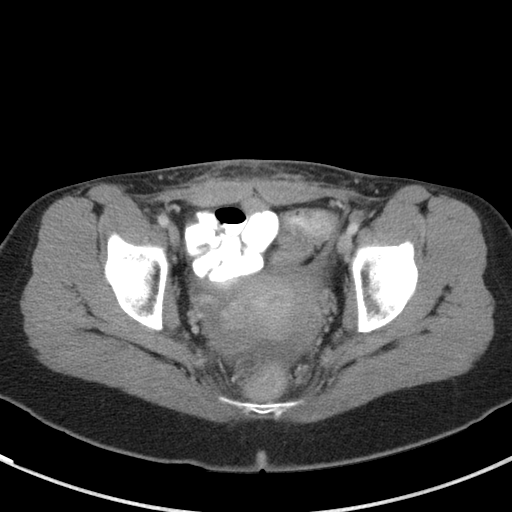
[im 23/79  soft-tissue]
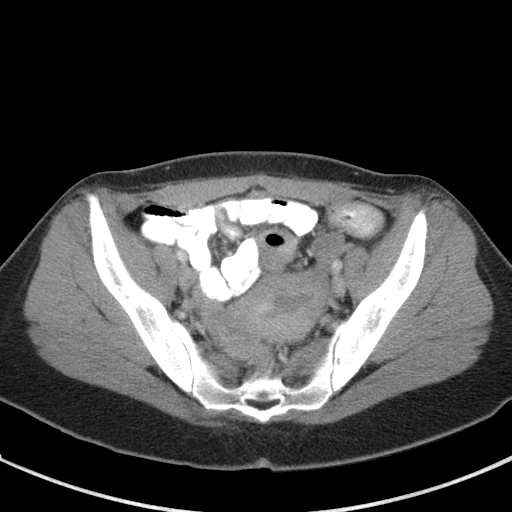
[im 28/79  soft-tissue]
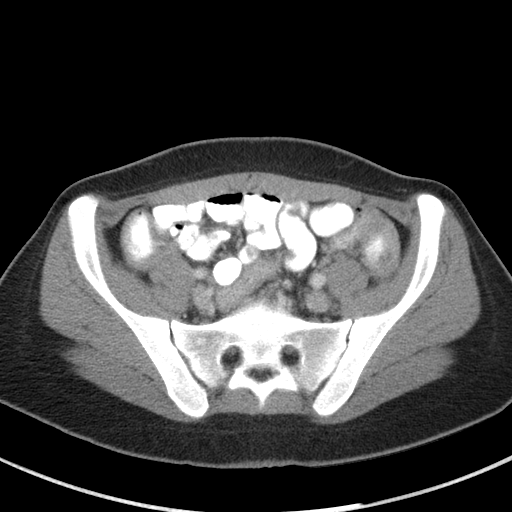
[im 33/79  soft-tissue]
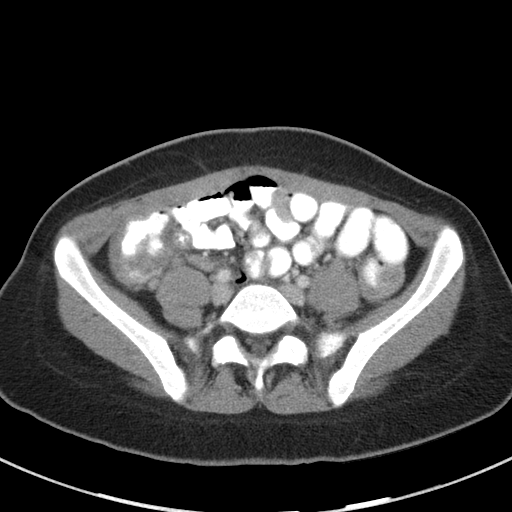
[im 41/79  soft-tissue]
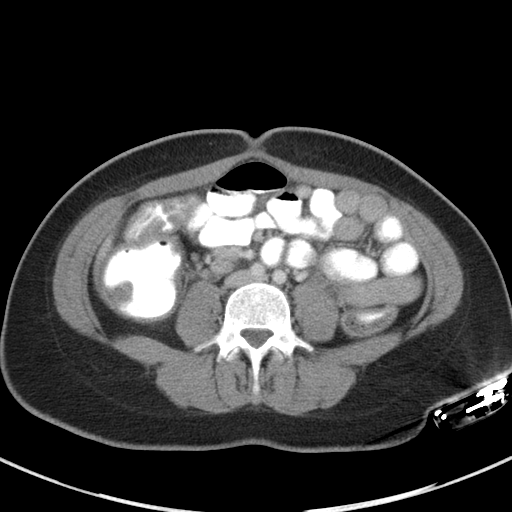
[im 46/79  soft-tissue]
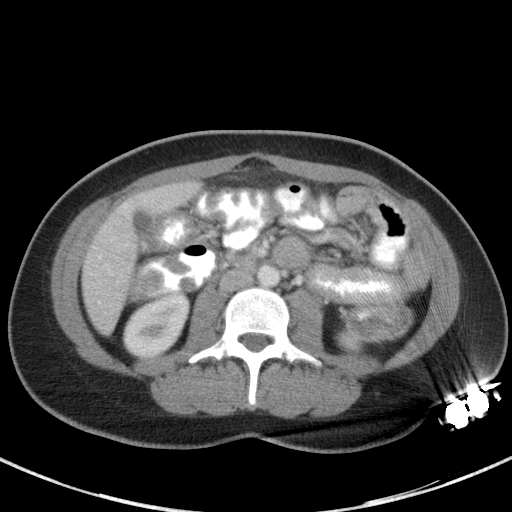
[im 51/79  soft-tissue]
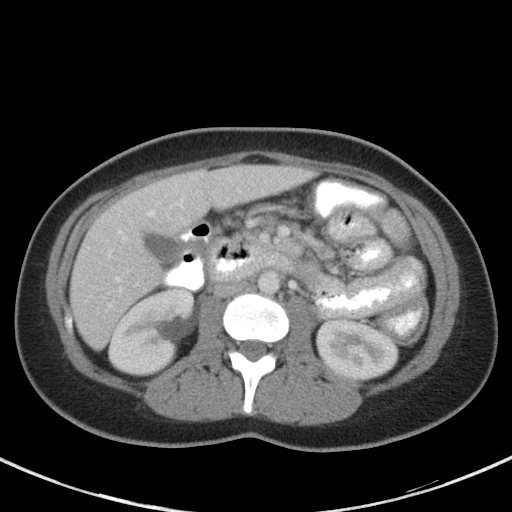
[im 51/79  bone]
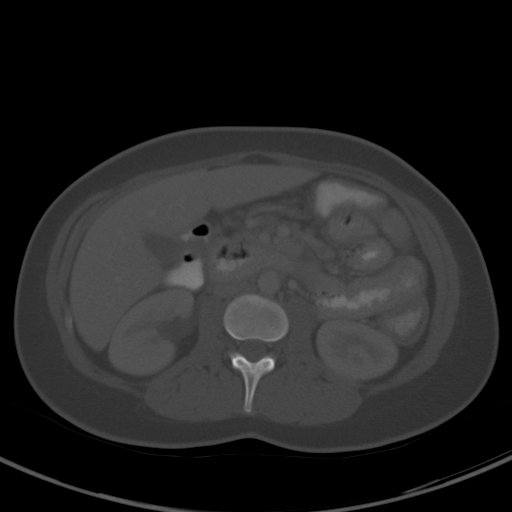
[im 56/79  soft-tissue]
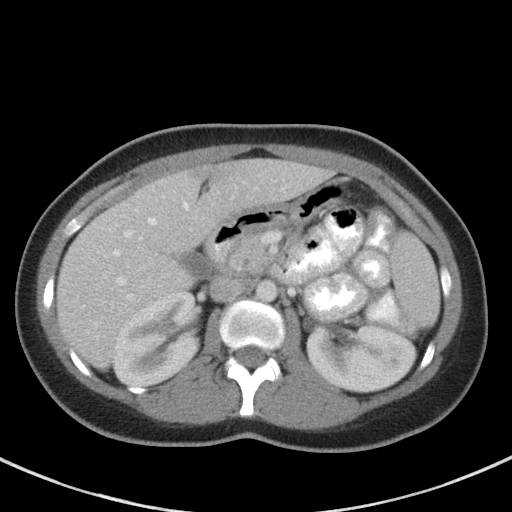
[im 63/79  soft-tissue]
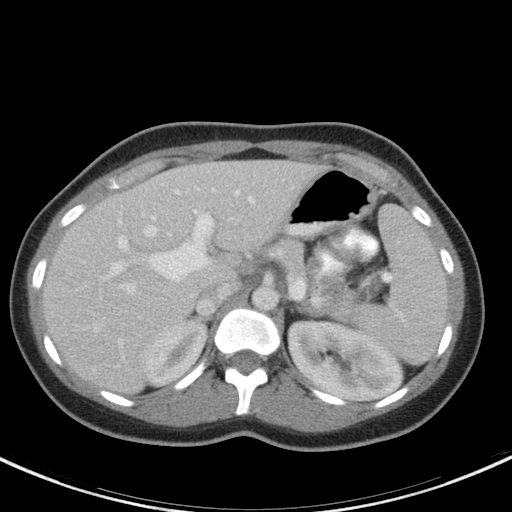
[im 68/79  soft-tissue]
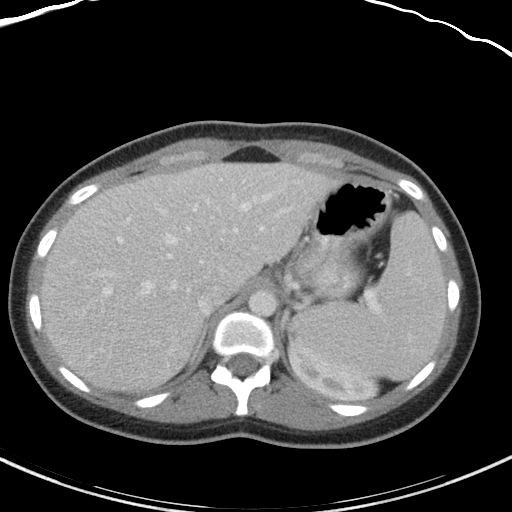
[im 73/79  soft-tissue]
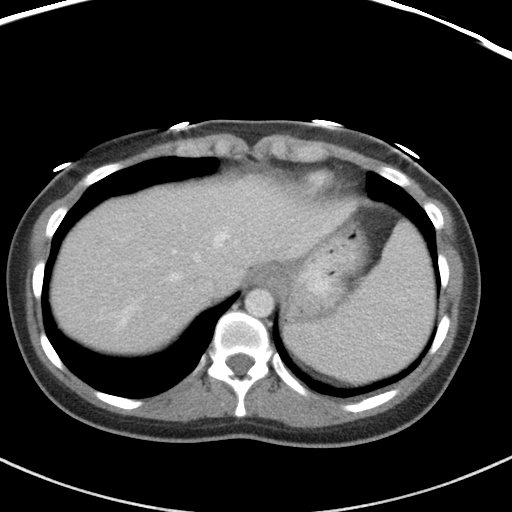

[Series 5: abd/pelvis 3.0 coronal · coronal · 0.66mm/px · 3 of 73 slices shown]
[im 25/73  soft-tissue]
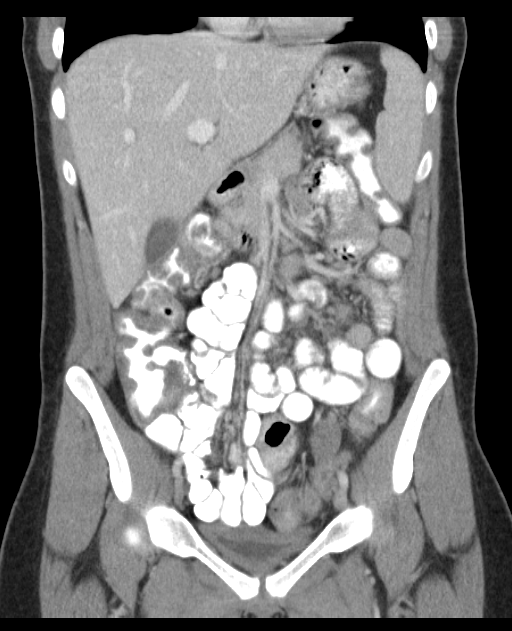
[im 33/73  soft-tissue]
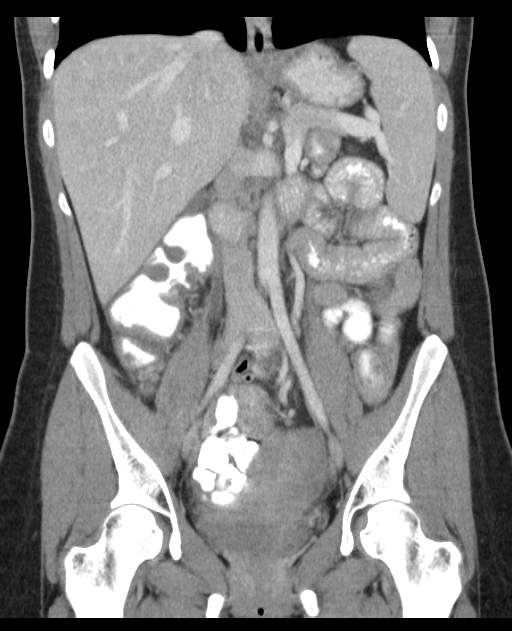
[im 41/73  soft-tissue]
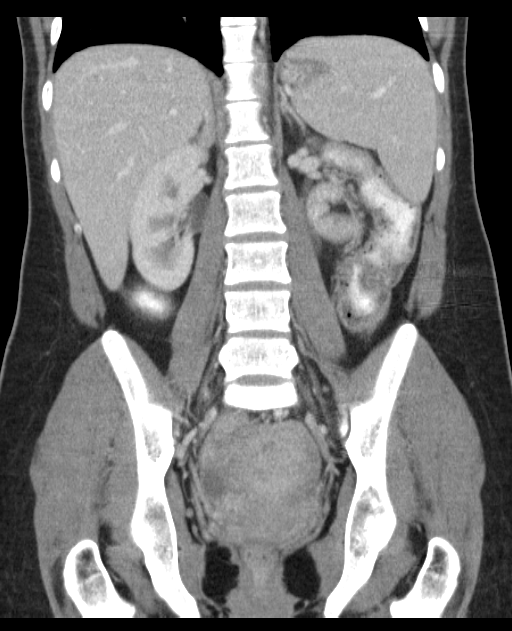

[16 of 46 positions shown; findings below may reference images not displayed]

FINDINGS: Clear lung bases.  Normal heart size without pericardial
or pleural effusion.  Focal steatosis adjacent the falciform
ligament.  Normal spleen, stomach, pancreas, gallbladder, biliary
tract, adrenal glands, kidneys. No retroperitoneal or retrocrural
adenopathy.

There is mild colonic wall thickening.  Most apparent within the
ascending colon.  Example image 35.  Normal terminal ileum.  The
appendix is normal, including on coronal images  29 - 31.

Apparent proximal small bowel wall thickening, including on image
33 is felt to be secondary to underdistension.  No small bowel
obstruction.  There are prominent ileocolic mesenteric lymph nodes,
likely reactive.  No ascites.

No pelvic adenopathy.  Normal urinary bladder.  Soft tissue
fullness within the uterus likely relates to postpartum state.
Small volume cul-de-sac fluid, likely physiologic.  No adnexal
mass.

Postoperative changes in the anterior pelvic wall. No acute osseous
abnormality.
IMPRESSION: 1.  Colonic wall thickening, favored to represent infectious
colitis.  Given the history of recent antibiotic usage, exclude C
difficile.  Primarily right-sided distribution argues against
inflammatory bowel disease/ulcerative colitis.
2.  Apparent small bowel wall thickening, favored to be due to
underdistension.  Concurrent enteritis is felt less likely.
3.  Recent postpartum uterus.

## 2011-07-20 ENCOUNTER — Ambulatory Visit (INDEPENDENT_AMBULATORY_CARE_PROVIDER_SITE_OTHER): Payer: Managed Care, Other (non HMO) | Admitting: Physician Assistant

## 2011-07-20 ENCOUNTER — Encounter (HOSPITAL_COMMUNITY): Payer: Self-pay | Admitting: Physician Assistant

## 2011-07-20 DIAGNOSIS — E109 Type 1 diabetes mellitus without complications: Secondary | ICD-10-CM | POA: Insufficient documentation

## 2011-07-20 DIAGNOSIS — F319 Bipolar disorder, unspecified: Secondary | ICD-10-CM

## 2011-07-20 DIAGNOSIS — F1120 Opioid dependence, uncomplicated: Secondary | ICD-10-CM

## 2011-07-20 DIAGNOSIS — J45909 Unspecified asthma, uncomplicated: Secondary | ICD-10-CM | POA: Insufficient documentation

## 2011-07-20 DIAGNOSIS — F1110 Opioid abuse, uncomplicated: Secondary | ICD-10-CM

## 2011-07-20 DIAGNOSIS — F429 Obsessive-compulsive disorder, unspecified: Secondary | ICD-10-CM

## 2011-07-20 MED ORDER — NALTREXONE HCL 50 MG PO TABS: 50.0000 mg | ORAL_TABLET | Freq: Every day | ORAL | Status: DC

## 2011-07-20 MED ORDER — LAMOTRIGINE 100 MG PO TABS: 100.0000 mg | ORAL_TABLET | Freq: Every day | ORAL | Status: DC

## 2011-07-20 MED ORDER — MIRTAZAPINE 45 MG PO TABS: 45.0000 mg | ORAL_TABLET | Freq: Every day | ORAL | Status: DC

## 2011-07-21 ENCOUNTER — Telehealth (HOSPITAL_COMMUNITY): Payer: Self-pay | Admitting: *Deleted

## 2011-07-21 NOTE — Telephone Encounter (Signed)
Left ZO[facility]. It is the one in [facility]

## 2011-07-26 ENCOUNTER — Other Ambulatory Visit (HOSPITAL_COMMUNITY): Payer: Self-pay | Admitting: Physician Assistant

## 2011-07-26 MED ORDER — LAMOTRIGINE 100 MG PO TABS: 100.0000 mg | ORAL_TABLET | Freq: Two times a day (BID) | ORAL | Status: DC

## 2011-07-26 MED ORDER — MIRTAZAPINE 45 MG PO TABS: 45.0000 mg | ORAL_TABLET | Freq: Every day | ORAL | Status: DC

## 2011-07-26 NOTE — Telephone Encounter (Signed)
Meds not able to be sent through surescripts.

## 2011-09-08 ENCOUNTER — Ambulatory Visit (INDEPENDENT_AMBULATORY_CARE_PROVIDER_SITE_OTHER): Payer: Self-pay | Admitting: Physician Assistant

## 2011-09-08 ENCOUNTER — Ambulatory Visit (HOSPITAL_COMMUNITY)
Admission: RE | Admit: 2011-09-08 | Discharge: 2011-09-08 | Disposition: A | Discharge status: Home / Self Care | Payer: Managed Care, Other (non HMO) | Attending: Psychiatry | Admitting: Psychiatry

## 2011-09-08 ENCOUNTER — Encounter (HOSPITAL_COMMUNITY): Payer: Self-pay | Admitting: *Deleted

## 2011-09-08 DIAGNOSIS — F319 Bipolar disorder, unspecified: Secondary | ICD-10-CM

## 2011-09-08 DIAGNOSIS — E109 Type 1 diabetes mellitus without complications: Secondary | ICD-10-CM | POA: Insufficient documentation

## 2011-09-08 DIAGNOSIS — Z87891 Personal history of nicotine dependence: Secondary | ICD-10-CM | POA: Insufficient documentation

## 2011-09-08 DIAGNOSIS — J45909 Unspecified asthma, uncomplicated: Secondary | ICD-10-CM | POA: Insufficient documentation

## 2011-09-08 DIAGNOSIS — Z6379 Other stressful life events affecting family and household: Secondary | ICD-10-CM | POA: Insufficient documentation

## 2011-09-08 DIAGNOSIS — F1120 Opioid dependence, uncomplicated: Secondary | ICD-10-CM | POA: Insufficient documentation

## 2011-09-08 DIAGNOSIS — F429 Obsessive-compulsive disorder, unspecified: Secondary | ICD-10-CM

## 2011-09-08 DIAGNOSIS — Z818 Family history of other mental and behavioral disorders: Secondary | ICD-10-CM | POA: Insufficient documentation

## 2011-09-08 NOTE — BH Assessment (Signed)
Assessment Note   Katherine Ray is an 21 y.o. female. Patient was a walk in to: behavioral health on referral of Rae Halsted PA.  Patient has history of opiate dependence and bipolar disorder.  Patients stopped opiates on her own while pregnant with her two year old son. She was taking oxycodone, hydrocodone and percocet tablets, 15 or more a day. Occasionally taking klonipin up to five tablets per day.  Patient is accompanied by her two year old son who is active, and she is appropriately responsive to him.  Patient is casually dressed, alert and oriented, pleasant, has reasonable insight. She states that for several months now she has been having increasing urges to use opiates and was tried on naltraxone, which did not decrease her opiate craving.  She lives with her son and baby's father. Her parents are also her support system.  She is currently attending [employer] and will be on break from a month or longer and feels that when she is not busy, she will be at high risk of using the opiates and perhaps of having a relapse of her bipolar disorder if she began using again. Pt spoke to her provider Jorje Guild PA and the he recommended she come for intensive outpatient treatment. Pt has a history of being suicidal, two suicide attempts in 2009 before she was treated for her bipolar disorder  and before she abused substances. She is a non smoker. Has an uncle with substance abuse and a grandmother and uncle with bipolar disorder. Discussed her treatment options and she will be going to intensive outpatient treatment three days a week.  Message left for an Logan Bores, message returned from a Charmian Muff who will contact pt to start program.   Axis I: Bipolar I d/o, Opiate Dependance Axis II: Deferred Axis III:  Past Medical History  Diagnosis Date  . Diabetes mellitus type 1   . Asthma   . Bipolar affective disorder    Axis IV: other psychosocial or environmental problems Axis V: 41-50  serious symptoms  Past Medical History:  Past Medical History  Diagnosis Date  . Diabetes mellitus type 1   . Asthma   . Bipolar affective disorder     No past surgical history on file.  Family History:  Family History  Problem Relation Age of Onset  . Bipolar disorder Maternal Grandmother   . Bipolar disorder Maternal Uncle   . Drug abuse Maternal Uncle   . Alcohol abuse Maternal Uncle     Social History:  reports that she has quit smoking. She does not have any smokeless tobacco history on file. She reports that she does not drink alcohol or use illicit drugs.  Additional Social History:  Alcohol / Drug Use Pain Medications: abusing opiates for several years none for 2 years Prescriptions: taking as Rx Over the Counter: nos History of alcohol / drug use?: Yes Substance #1 Name of Substance 1: opiate pain meds 1 - Age of First Use: 17 1 - Amount (size/oz): 15 tabs per day 1 - Frequency: daily until 2 years ago 1 - Last Use / Amount: 2 years ago  CIWA:   COWS:    Allergies: No Known Allergies  Home Medications:  (Not in a hospital admission)  OB/GYN Status:  No LMP recorded.  General Assessment Data Location of Assessment: [facility] Assessment Services Living Arrangements: Spouse/significant other;Children Can pt return to current living arrangement?: Yes Admission Status: Voluntary Is patient capable of signing voluntary admission?: Yes  Transfer from: Home Referral Source: Psychiatrist Lovey Newcomer PA)  Education Status Is patient currently in school?: Yes Highest grade of school patient has completed: 60 Name of school: [employer]  Risk to self Suicidal Ideation: No Suicidal Intent: No Is patient at risk for suicide?: No Suicidal Plan?: No Access to Means: No What has been your use of drugs/alcohol within the last 12 months?: previous opiate, benzo abuse Previous Attempts/Gestures: Yes How many times?: 2  (2009) Other Self Harm Risks: 0 Triggers for  Past Attempts:  (untreated Bipolar) Intentional Self Injurious Behavior: None Family Suicide History: Unknown (Mo, bro, uncle SA) Persecutory voices/beliefs?: No Depression: No Substance abuse history and/or treatment for substance abuse?: Yes Suicide prevention information given to non-admitted patients: Yes  Risk to Others Homicidal Ideation: No Thoughts of Harm to Others: No Current Homicidal Intent: No Current Homicidal Plan: No Access to Homicidal Means: No History of harm to others?: No Assessment of Violence: None Noted Does patient have access to weapons?: No Criminal Charges Pending?: No Does patient have a court date: No  Psychosis Hallucinations: None noted Delusions: None noted  Mental Status Report Appear/Hygiene:  (casual, neat and clean, dyed red hair) Eye Contact: Good Motor Activity: Unremarkable Speech: Logical/coherent Level of Consciousness: Alert Mood: Anxious Affect: Anxious (mild) Anxiety Level: Minimal Thought Processes: Coherent;Relevant Judgement: Unimpaired Orientation: Person;Place;Time;Situation Obsessive Compulsive Thoughts/Behaviors: None  Cognitive Functioning Concentration: Normal Memory: Recent Intact;Remote Intact IQ: Average Insight: Good Impulse Control: Fair Appetite: Good Weight Loss: 0  Weight Gain: 0  Sleep: No Change Vegetative Symptoms: None  ADLScreening [facility]) Patient's cognitive ability adequate to safely complete daily activities?: Yes Patient able to express need for assistance with ADLs?: Yes Independently performs ADLs?: Yes  Abuse/Neglect [facility]) Physical Abuse: Denies Verbal Abuse: Denies Sexual Abuse: Denies  Prior Inpatient Therapy Prior Inpatient Therapy: No  Prior Outpatient Therapy Prior Outpatient Therapy: Yes Prior Therapy Dates: current Prior Therapy Facilty/Provider(s): [facility] Reason for Treatment: Bipolar disorder  ADL Screening (condition at time of  admission) Patient's cognitive ability adequate to safely complete daily activities?: Yes Patient able to express need for assistance with ADLs?: Yes Independently performs ADLs?: Yes Weakness of Legs: None Weakness of Arms/Hands: None  Home Assistive Devices/Equipment Home Assistive Devices/Equipment: None    Abuse/Neglect Assessment (Assessment to be complete while patient is alone) Physical Abuse: Denies Verbal Abuse: Denies Sexual Abuse: Denies Exploitation of patient/patient's resources: Denies Self-Neglect: Denies     Merchant navy officer (For Healthcare) Advance Directive: Patient does not have advance directive Pre-existing out of facility DNR order (yellow form or pink MOST form): No Nutrition Screen Diet: Regular Unintentional weight loss greater than 10lbs within the last month: No Problems chewing or swallowing foods and/or liquids: No Home Tube Feeding or Total Parenteral Nutrition (TPN): No Patient appears severely malnourished: No Pregnant or Lactating: No  Additional Information 1:1 In Past 12 Months?: No CIRT Risk: No Elopement Risk: No     Disposition:  Disposition Disposition of Patient: Outpatient treatment Type of outpatient treatment: Chemical Dependence - Intensive Outpatient  On Site Evaluation by:   Reviewed with Physician:     Conan Bowens 09/08/2011 6:32 PM

## 2011-09-09 NOTE — Progress Notes (Signed)
   [facility] Follow-up Outpatient Visit  Katherine Ray 1990-12-22  Date: 09/08/2011   Subjective: [name] presents today to followup on her treatment for bipolar disorder and anxiety. She reports that things have been rather stressful recently and she has her last final for this semester tomorrow. She reports that her mood has been up and down, and she reports that she has less control of her mood recently because of stress at school and their relationship with her son's father. She also endorses that her anxiety has been increased. Her cravings for opiates have gotten worse, and he naltrexone prescribed at her last visit seems to be of no benefit. She has not succumbed to the cravings, but she reports she has been close. As she will be out of school for the next several weeks, and with the lack of structure and responsibility she feels she has a higher risk for relapse. Therefore, she expresses a desire for substance abuse treatment. She denies any current suicidal or homicidal ideation. She denies any auditory or visual hallucinations.  There were no vitals filed for this visit.  Mental Status Examination  Appearance: Well groomed and casually dressed Alert: Yes Attention: good  Cooperative: Yes Eye Contact: Good Speech: Clear and coherent Psychomotor Activity: Normal Memory/Concentration: Intact Oriented: person, place, time/date and situation Mood: Anxious Affect: Congruent Thought Processes and Associations: Linear and Logical Fund of Knowledge: Good Thought Content: Normal Insight: Good Judgement: Good  Diagnosis: Bipolar disorder, OCD  Treatment Plan: We will discontinue the naltrexone as it seems to be of no benefit. We'll continue her Lamictal 100 mg twice daily and Remeron 45 mg at bedtime. She is to be assessed for enrollment in the intensive outpatient program for substance abuse. After she completes that program she will followup again with this  provider.  Jameson Tormey, PA-C

## 2011-09-10 ENCOUNTER — Other Ambulatory Visit (HOSPITAL_COMMUNITY): Payer: Self-pay | Admitting: Physician Assistant

## 2011-09-12 ENCOUNTER — Encounter (HOSPITAL_COMMUNITY): Payer: Self-pay | Admitting: Physician Assistant

## 2011-09-12 ENCOUNTER — Encounter (HOSPITAL_COMMUNITY): Payer: Self-pay | Admitting: Psychology

## 2011-09-12 ENCOUNTER — Other Ambulatory Visit (HOSPITAL_COMMUNITY): Payer: Managed Care, Other (non HMO) | Attending: Psychiatry | Admitting: Psychology

## 2011-09-12 DIAGNOSIS — F411 Generalized anxiety disorder: Secondary | ICD-10-CM | POA: Insufficient documentation

## 2011-09-12 DIAGNOSIS — F429 Obsessive-compulsive disorder, unspecified: Secondary | ICD-10-CM | POA: Insufficient documentation

## 2011-09-12 DIAGNOSIS — F3160 Bipolar disorder, current episode mixed, unspecified: Secondary | ICD-10-CM | POA: Insufficient documentation

## 2011-09-12 DIAGNOSIS — F1120 Opioid dependence, uncomplicated: Secondary | ICD-10-CM | POA: Insufficient documentation

## 2011-09-12 DIAGNOSIS — F1994 Other psychoactive substance use, unspecified with psychoactive substance-induced mood disorder: Secondary | ICD-10-CM | POA: Insufficient documentation

## 2011-09-12 DIAGNOSIS — E109 Type 1 diabetes mellitus without complications: Secondary | ICD-10-CM | POA: Insufficient documentation

## 2011-09-12 NOTE — Progress Notes (Signed)
Psychiatric Assessment Adult  Patient Identification:  Katherine Ray Date of Evaluation:  07/20/2011 Chief Complaint: Bipolar disorder History of Chief Complaint:   Chief Complaint  Patient presents with  . Depression  . Manic Behavior  . Anxiety  . Establish Care    HPI Katherine Ray presents today to establish care. She reports that she was diagnosed as bipolar in 2009. She has not seen an M.D. for 6 months, and has been off of her medications for one month. And a significant amount of irritability. She endorses suicidal ideation with a plan to overdose if she were to attempt, but denies any intent. She reports that she had a manic episode approximately 2 weeks ago. She also endorses  Symptoms of OCD, those being counting, checking, having to keep food separated, and he in items by twos. She reports that she has had one panic attack in the past, and also suffers from social anxiety. She's cites recent stress from school and a relationship with the father of her son.  Katherine Ray reports that she was hospitalized twice in 2009 for depression, and was diagnosed with bipolar disorder. She then began seeing Sheldon Silvan for medication management and a therapist by the name of Glendell Docker.  Katherine Ray first received psychiatric care while she was in the first or second grade. She was diagnosed with a processing delay disorder, then in high school she was treated for ADHD. She was prescribed Vyvanse, but found that was no real help. Also while in high school, she was diagnosed with generalized anxiety disorder.  Katherine Ray also reports that she has had a problem with substance abuse in the past. She experimented in high school and college, then became addicted to opiates in 2010. She would take these opiates orally or by snorting them. Her last use was May 15 of 2011. She admits to abusing opiates, marijuana, benzodiazepines, and alcohol. She endorses continued cravings for opiate pain medications.  Review of  Systems Physical Exam Katherine Ray is a well-nourished well-developed white female who presents as fully alert and oriented and in no acute distress. She is able to ambulate without assistance.  Depressive Symptoms: depressed mood, anhedonia, fatigue, feelings of worthlessness/guilt, difficulty concentrating, hopelessness, suicidal thoughts with specific plan, anxiety, panic attacks, loss of energy/fatigue, disturbed sleep,  (Hypo) Manic Symptoms:   Elevated Mood:  Yes Irritable Mood:  Yes Grandiosity:  No Distractibility:  Yes Labiality of Mood:  Yes Delusions:  No Hallucinations:  No Impulsivity:  Yes Sexually Inappropriate Behavior:  No Financial Extravagance:  No Flight of Ideas:  No  Anxiety Symptoms: Excessive Worry:  Yes Panic Symptoms:  Yes Agoraphobia:  No Obsessive Compulsive: Yes  Symptoms: Checking, Counting, Specific Phobias:  No Social Anxiety:  Yes  Psychotic Symptoms:  Hallucinations: No None Delusions:  No Paranoia:  No   Ideas of Reference:  No  PTSD Symptoms: Ever had a traumatic exposure:  No Had a traumatic exposure in the last month:  No Re-experiencing: No None Hypervigilance:  No Hyperarousal: No None Avoidance: No None  Traumatic Brain Injury: No   Past Psychiatric History: Diagnosis: Bipolar 1, OCD, GAD, ADHD   Hospitalizations: Twice in 2009   Outpatient Care: Sheldon Silvan, Glendell Docker   Substance Abuse Care: None   Self-Mutilation:  none  Suicidal Attempts: None   Violent Behaviors: None    Past Medical History:   Past Medical History  Diagnosis Date  . Diabetes mellitus type 1   . Asthma   . Bipolar affective disorder  History of Loss of Consciousness:  No Seizure History:  No Cardiac History:  No Allergies:  No Known Allergies Current Medications:  Current Outpatient Prescriptions  Medication Sig Dispense Refill  . insulin aspart (NOVOLOG) 100 UNIT/ML injection Inject into the skin 3 (three) times daily before  meals.      . insulin glargine (LANTUS) 100 UNIT/ML injection Inject 29 Units into the skin daily.      Marland Kitchen lamoTRIgine (LAMICTAL) 100 MG tablet Take 1 tablet (100 mg total) by mouth 2 (two) times daily.  180 tablet  0  . mirtazapine (REMERON) 45 MG tablet Take 1 tablet (45 mg total) by mouth at bedtime.  90 tablet  0  . mirtazapine (REMERON) 45 MG tablet TAKE 1 TABLET BY MOUTH AT BEDTIME.  30 tablet  0    Previous Psychotropic Medications:  Medication Dose   Lamictal   100 mg twice daily   Remeron   45 mg at bedtime                   Substance Abuse History in the last 12 months: As stated above, Katherine Ray has been abstinent from her primary drug of addiction, opiates, since 07/12/2009. She experimented in high school with opiates, marijuana, benzodiazepines, and alcohol. She has never had any substance abuse treatment. She continues to experience cravings for opiates.  Social History: Katherine Ray was born in [place] and grew up in [place]. She reports that she had a good childhood, and denies any abuse. She has 2 sisters, one twin, and one older. 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 She is currently caring for her paternal grandmother. She lives with her son and her grandmother. She denies any current legal issues. She affiliates as Katherine Ray. She reports that her social support system consists of her father, her mother, and her sister.  Family History:   Family History  Problem Relation Age of Onset  . Bipolar disorder Maternal Grandmother   . Bipolar disorder Maternal Uncle   . Drug abuse Maternal Uncle   . Alcohol abuse Maternal Uncle     Mental Status Examination/Evaluation: Objective:  Appearance: Casual and Well Groomed  Eye Contact::  Good  Speech:  Clear and Coherent  Volume:  Normal  Mood:  Anxious     Affect:  Congruent  Thought Process:  Linear  Orientation:  Full  Thought Content:  WDL  Suicidal Thoughts:  Yes.  without intent/plan  Homicidal Thoughts:  No  Judgement:  Good  Insight:  Good  Psychomotor Activity:  Normal  Akathisia:  No  Handed:  Right  AIMS (if indicated):    Assets:  Communication Skills Desire for Improvement Physical Health Resilience Transportation    Laboratory/X-Ray Psychological Evaluation(s)        Assessment:    AXIS I Bipolar, mixed and Substance Abuse  AXIS II Deferred  AXIS III Past Medical History  Diagnosis Date  . Diabetes mellitus type 1   . Asthma   . Bipolar affective disorder      AXIS IV economic problems, educational problems and occupational problems  AXIS V 51-60 moderate symptoms   Treatment Plan/Recommendations:  Plan of Care: We will continue her medications as previously prescribed, and prescribed for her naltrexone for opiate cravings. We will consider referral for therapy.   Laboratory:    Psychotherapy:   Medications:  Lamictal 100 mg twice daily, Remeron 45 mg at bedtime, naltrexone 50 mg daily.   Routine PRN Medications:  No  Consultations:   Safety Concerns:  Risk for relapse on opiates, past history of suicidal ideation   Other:      Kayveon Lennartz, PA-C 7/15/20131:48 PM

## 2011-09-13 NOTE — Progress Notes (Signed)
    Daily Group Progress Note  Program: CD-IOP   Group Time: 1-2:30 pm  Participation Level: Minimal  Behavioral Response: Appropriate, sharing  Type of Therapy: Process Group  Topic: Group Process and Introductions: first half of session spent in process. The group discussed the events of the past weekend and per reports, everyone remained abstinent. There were 2 new members in group today and they introduced themselves. They displayed powerful emotions about events that have led them to this program and the group gave them a warm welcome. There was good sharing and feedback among the group.  Group Time: 2:45- 4pm  Participation Level: Active  Behavioral Response: Sharing  Type of Therapy: Psycho-education Group  Topic: Addiction: A Biological, Psychological, Social and Spiritual Disease. Second half of group included a psycho-educational piece. A presentation was provided on the 4 main elements that contribute to addiction. While members clearly understood that they could not do anything about their genetic predisposition to addiction, the other 3 major elements can be addressed directly and behaviors changed accordingly. Members expressed the frustration they feel when society stigmatizes them for being weak-willed or lacking in discipline. Emphasis was placed on group members understanding and accepting they have a biologically-based illness and it has nothing to do with lack of willpower or effort. One of the new members is a Type 1 Diabetic and she explained what she has to do throughout the day to maintain proper and healthy blood sugar levels. The purpose of the session was to educate and empower group members to recognize they can actively address this chronic disease and put it and keep it in remission.   Summary: the patient was new to the group and checked-in with a June 22nd sobriety date. She reported she had been using opiates for about 5 years, except during the time she  was pregnant with her son. She reported she has been told she needs to get clean or she risks losing custody of the 25 mo infant to the father of her child and her S/O. She became teary as she disclosed this information. She admitted she had never been in treatment before. The group welcomed her and encouraged her to attend meetings. In the second part of group, the patient shared that she is a Type 1 Diabetic and has an insulin pump. She showed the group the pump attached to the left side of her torso and another instrument that measures her blood sugar on the right side. She admitted it took some organization and time but, overall, it is much easier than having to prick her finger and take her blood sugar and inject insulin many times over the day. When I emphasized the importance of diet in maintaining one's blood sugar, this patient described what would happen to her if she went too long without food or overate after a period of not eating. She provided excellent information and education to this group about diabetes, blood sugar and how to maintain balanced levels of blood sugar. She responded very well to her first session and made some excellent comments.   Family Program: Family present? No   Name of family member(s):   UDS collected: No Results:  AA/NA attended?: No  Sponsor?: No   Caresse Sedivy, LCAS

## 2011-09-14 ENCOUNTER — Encounter (HOSPITAL_COMMUNITY): Payer: Self-pay | Admitting: Psychology

## 2011-09-14 ENCOUNTER — Other Ambulatory Visit (HOSPITAL_COMMUNITY): Payer: Managed Care, Other (non HMO) | Admitting: Psychology

## 2011-09-14 NOTE — Progress Notes (Signed)
Patient ID: Katherine Ray, female   DOB: 11/20/1990, 21 y.o.   MRN: [id number] Orientation to CD-IOP: The patient is a 21 yo single, caucasian, female seeking entry into the CD-IOP to address her opioid dependence. She lives in [place] with her S/O and their 90 mo son, Katherine Ray. She attends [employer] in [place] and is enrolled in the pre-nursing program, but is currently between semesters. The patient was referred to the program by Katherine Ray here at [facility] outpatient. He just began working with her and is prescribing Lamictal and Remeron to address her Bipolar Disorder. The patient reported she began drinking alcohol when she was around 21 yo and the opiates began at age 36. Her S/O has told her if she doesn't get some help and stop using he will take their child away from her. She has remained abstinent since June 22, but has never had any sort of SA treatment so she lacks the tools necessary for long term abstinence. The patient reported she is estranged from her parents, who divorced in 2012. While her mother lives in [place], her father currently resides in [place]. The patient's older sister is in [place] along with her twin sister, Katherine Ray. The patient reported her twin sister is supportive, but her parents won't have anything to do with her because of her addiction. In addition to her S/O and infant son, the patient lives with and cares for her paternal grandmother who is suffering from dementia. Her father pays her to care for his mother. The patient reported she has diabetes and developed Type 1 when she was 21 yo. She has an insulin pump, but admitted she doesn't take very good care of herself. She denied that her boyfriend uses chemicals, but reported he still smokes cigarettes while she stopped smoking over 2 years ago when she found out she was pregnant. The patient reported acknowledged she is Bipolar, but suffers more from depression than mania. She noted her childhood was good, but there were many deaths among  friends and family and she reported, "I attended 54 funerals". At times, the patient was teary, especially as she recounted the possibility of not being with her son if she continues to use drugs. She displayed good motivation, but admitted she is somewhat shy. I assured her she would find good support in the group and we will help her learn to better communicate her needs and be more assertive in her life. All documentation was reviewed, signatures gathered and the orientation completed accordingly. The patient will return at 1 pm and enter the CD-IOP.

## 2011-09-15 NOTE — Progress Notes (Signed)
Daily Group Progress Note  Program: CD-IOP   Group Time: 1-2:30 pm  Participation Level: Active  Behavioral Response: Appropriate  Type of Therapy: Psycho-education Group  Topic:Shame: first part of group was presentation on shame. This psycho-educational piece focused on the development of shame and how it perpetuates one's addiction. The feeling that one is fundamentally flawed leads to fear that others will recognize this fundamental flaw and the pain and terror associated with this recognition. As a result, the shamed individual learns to isolate in order to distance themselves from others. This generates an ongoing insidious cycle of shame, fear and isolation. Members discussed their shame and how it has led to them seeking alcohol and drugs to numb their pain. The way out of this destructive cycle was identified and that includes breaking out of the isolation and meeting others struggling with these same issues. They are found within [facility]. A number of members agreed that they have found support and love in "the rooms" and is has given them a sense of belonging and connection that was lacking in the past.   Group Time: 2:45- 4pm  Participation Level: Active  Behavioral Response: Appropriate and Sharing  Type of Therapy: Process Group  Topic: Building a Recovery Tool Checklist/Process: the second half of group was spent in further education and a handout was provided with a checklist of recovery tools listed on a weekly calendar. Members discussed all of those listed and were provided additional handouts with the tool section left blank so they could write-in their own tools. As the session ended, members shared about their current struggles and issues. One of the new members reported he had interviewed at the [facility] in the [place] neighborhood. He admitted he was a little hesitant about moving in because it as in an area that he had never  lived in before. It was an awkward explanation and he finally agreed that it was a low-income neighborhood that he found uncomfortable. A brief Pro vs. Con list was written on the board and in the end, he agreed there were no Cons except for the type of neighborhood. He thanked the group for their help.   Summary: the patient checked-in and reported she had attended an NA meeting. She agreed that they were very welcoming and had given her hugs and phone numbers. She smiled when the other younger group member expressed happiness that Domenique was here and reported she had prayed to God to send someone young to the group. The patient cried as she reported she had felt very comfortable here in this group on Monday and despite only 2 sessions, she stated that the group had helped her.  The patient's parents have both rejected her because of her addiction, but she shared further that her father had been caught having an affair and this had been devastating to everyone, including herself. "I had my dad up on a pedestal, and it was so painful when the affair came to light". She also shared more information about her 2 close friends that had relapsed and explained that they had all been using together, but she finally went to bed. When she woke up the next morning, she found them laying out on the back steps and "they were blue". The patient is very emotional and becomes teary easily, but she is talking and expressing a lot of her pain and shame. The group appears to sense her fragility and was very supportive and  validating. She is doing well after only 2 group sessions in treatment.  Family Program: Family present? No   Name of family member(s):   UDS collected: No Results:   AA/NA attended?: Dalphine Handing was her first NA meeting   Sponsor?: No   Neal Trulson, LCAS

## 2011-09-15 NOTE — Progress Notes (Signed)
Patient ID: Katherine Ray, female   DOB: 1990/08/24, 21 y.o.   MRN: [id number] Treatment Planning Session: I met with this patient at the conclusion of the CD-IOP group session this afternoon. She reported she had only been in 2 groups, but she felt that they had been very helpful and she could open up and be honest about her life and her addiction. I explained the need to identify her goals for treatment. She was agreeable to the first 2 goals of ongoing sobriety and building a support network. The patient identified that she also wanted to work on addressing her Bipolar Disorder. She reported she has struggled more with the depression than the mania, but admitted when she gets manic she gets very aggressive and sometimes even physical. When asked about her S/O, she admitted that they had not been getting along and he had threatened to take their child away if she didn't get into treatment. She reported he doesn't really spend much time with the baby, though, and that he is usually hunting or fishing with his father when he is not working. She explained that if he took the baby, it would be his mother who would raise it. She insisted that there was no way anyone was taking her baby. The patient described more about her childhood. Her maternal uncle had turned her on to drugs and alcohol and remains in his active addiction. Her parent's rejection of her in the past year because of her addiction has proven very painful and she hopes that recovery will provide her with the opportunity to reconnect with them. The patient seems very motivated, but has little support for her recovery outside of the group and her twin sister in [place]. The patient's S/O is supportive, but there seems to be some control issues and he may not understand the disease concept of addiction. I offered to meet with the patient and Viviann Spare and would accomodate his work schedule. All documentation was reviewed, signed and the treatment plan  completed accordingly.

## 2011-09-16 ENCOUNTER — Encounter (HOSPITAL_COMMUNITY): Payer: Self-pay

## 2011-09-16 ENCOUNTER — Other Ambulatory Visit (HOSPITAL_COMMUNITY): Payer: Managed Care, Other (non HMO) | Admitting: Psychology

## 2011-09-16 DIAGNOSIS — F1920 Other psychoactive substance dependence, uncomplicated: Secondary | ICD-10-CM | POA: Insufficient documentation

## 2011-09-16 NOTE — Progress Notes (Unsigned)
Psychiatric Assessment Adult  Patient Identification:  Katherine Ray Date of Evaluation:  09/16/2011 Chief Complaint: Opioid dependence History of Chief Complaint:   Chief Complaint  Patient presents with  . Addiction Problem  . Establish Care    HPI Katherine Ray was referred to the Chemical Dependency Intensive Outpatient Program by this provider as she sees me in my clinical practice. She has a history of bipolar disorder and substance dependence, chiefly prescription opiates.  She reports that she began drinking alcohol, smoking marijuana, and using prescription opiates around age 69. This began when she found that her parents have had affairs, and she sought comfort from her uncle who is an alcoholic and drug addict. She tried to control her use but it increased to a daily habit. She has suffered consequences of her drug use including loss of family, loss of jobs, and it has had an impact on her education. She has never been arrested, but she describes a close call. She also reports that her son's father wants to take her son away. Review of Systems Physical Exam Katherine Ray is a well-nourished well-developed white female who presents as fully alert and oriented and in no acute distress. She is able to angulate without assistance.  Depressive Symptoms: depressed mood, anhedonia, fatigue, feelings of worthlessness/guilt, difficulty concentrating, hopelessness, anxiety, panic attacks, loss of energy/fatigue, disturbed sleep,  (Hypo) Manic Symptoms:   Elevated Mood:  Yes Irritable Mood:  Yes Grandiosity:  No Distractibility:  Yes Labiality of Mood:  Yes Delusions:  No Hallucinations:  No Impulsivity:  Yes Sexually Inappropriate Behavior:  No Financial Extravagance:  No Flight of Ideas:  No  Anxiety Symptoms: Excessive Worry:  Yes Panic Symptoms:  Yes Agoraphobia:  No Obsessive Compulsive: Yes  Symptoms: Checking, Specific Phobias:  No Social Anxiety:  Yes  Psychotic  Symptoms:  Hallucinations: No None Delusions:  No Paranoia:  No   Ideas of Reference:  No  PTSD Symptoms: Ever had a traumatic exposure:  No Had a traumatic exposure in the last month:  No Re-experiencing: No None Hypervigilance:  No Hyperarousal: No None Avoidance: No None  Traumatic Brain Injury: No   Past Psychiatric History: Diagnosis: Bipolar 1, OCD, GAD, ADHD   Hospitalizations: Twice in 2009   Outpatient Care: Sheldon Silvan, Glendell Docker   Substance Abuse Care: None   Self-Mutilation: None   Suicidal Attempts: None   Violent Behaviors: None    Past Medical History:   Past Medical History  Diagnosis Date  . Diabetes mellitus type 1   . Asthma   . Bipolar affective disorder   . Opiate dependence    History of Loss of Consciousness:  No Seizure History:  No Cardiac History:  No Allergies:  No Known Allergies Current Medications:  Current Outpatient Prescriptions  Medication Sig Dispense Refill  . insulin aspart (NOVOLOG) 100 UNIT/ML injection Inject into the skin 3 (three) times daily before meals.      . insulin glargine (LANTUS) 100 UNIT/ML injection Inject 29 Units into the skin daily.      Marland Kitchen lamoTRIgine (LAMICTAL) 100 MG tablet Take 1 tablet (100 mg total) by mouth 2 (two) times daily.  180 tablet  0  . mirtazapine (REMERON) 45 MG tablet Take 1 tablet (45 mg total) by mouth at bedtime.  90 tablet  0  . mirtazapine (REMERON) 45 MG tablet TAKE 1 TABLET BY MOUTH AT BEDTIME.  30 tablet  0    Previous Psychotropic Medications:  Medication Dose   Lamictal  100 mg twice daily   Remeron   45 mg at bedtime                   Substance Abuse History in the last 12 months: Katherine Ray began drinking alcohol, smoking marijuana, and taking prescription opiates at age 40. This progressed to daily use of opiate pain medications, which she would snort or  Take orally. She denies any IV drug use.she is never been detoxed, and has never attended any recovery based  treatment.   Medical Consequences of Substance Abuse: None   Legal Consequences of Substance Abuse: None   Family Consequences of Substance Abuse: Affected relationship with parents and sister.   Blackouts:  No DT's:  No Withdrawal Symptoms:  Yes Cramps Diaphoresis Diarrhea Headaches Nausea Tremors Vomiting  Social History: Katherine Ray was born in [place] and grew up in [place]. She reports that she had a good childhood, and denies any abuse. She has 2 sisters, one twin, and one older. 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 She is currently caring for her paternal grandmother. She lives with her son and her grandmother. She denies any current legal issues. She affiliates as Curator. She reports that her social support system consists of her father, her mother, and her sister.   Family History:   Family History  Problem Relation Age of Onset  . Bipolar disorder Maternal Grandmother   . Bipolar disorder Maternal Uncle   . Drug abuse Maternal Uncle   . Alcohol abuse Maternal Uncle     Mental Status Examination/Evaluation: Objective:  Appearance: Casual  Eye Contact::  Good  Speech:  Clear and Coherent  Volume:  Normal  Mood:  Euthymic   Affect:  Appropriate  Thought Process:  Logical  Orientation:  Full  Thought Content:  WDL  Suicidal Thoughts:  No  Homicidal Thoughts:  No  Judgement:  Fair  Insight:  Fair  Psychomotor Activity:  Normal  Akathisia:  No  Handed:  Right  AIMS (if indicated):    Assets:  Communication Skills Desire for Improvement Housing Leisure Time Physical Health Resilience Transportation Vocational/Educational    Laboratory/X-Ray Psychological Evaluation(s)        Assessment:    AXIS I Bipolar, mixed, Substance Abuse and Substance Induced Mood Disorder  AXIS II Deferred   AXIS III Past Medical History  Diagnosis Date  . Diabetes mellitus type 1   . Asthma   . Bipolar affective disorder   . Opiate dependence      AXIS IV problems with primary support group  AXIS V 51-60 moderate symptoms   Treatment Plan/Recommendations:  Plan of Care: Admit to IOP, encourage attendance at 12-step meetings, continue medications   Laboratory:  Random urine drug screens  Psychotherapy: Group therapy   Medications: Lamictal 100 mg twice daily, Remeron 45 mg at bedtime   Routine PRN Medications:  No  Consultations:   Safety Concerns:  Risk for relapse   Other:      [facility] 7/19/20133:20 PM

## 2011-09-17 LAB — PRESCRIPTION ABUSE MONITORING 17P, URINE
Barbiturate Screen, Urine: NEGATIVE ng/mL
Benzodiazepine Screen, Urine: NEGATIVE ng/mL
Buprenorphine, Urine: NEGATIVE ng/mL
Cannabinoid Scrn, Ur: NEGATIVE ng/mL
Carisoprodol, Urine: NEGATIVE ng/mL
Creatinine, Urine: 17.79 mg/dL — ABNORMAL LOW (ref >20.0)
Fentanyl, Ur: NEGATIVE ng/mL
Meperidine, Ur: NEGATIVE ng/mL
Methadone Screen, Urine: NEGATIVE ng/mL
Tapentadol, urine: NEGATIVE ng/mL

## 2011-09-17 LAB — ALCOHOL METABOLITE (ETG), URINE: Ethyl Glucuronide (EtG): NEGATIVE ng/mL

## 2011-09-19 ENCOUNTER — Other Ambulatory Visit (HOSPITAL_COMMUNITY): Payer: Managed Care, Other (non HMO) | Admitting: Psychology

## 2011-09-19 NOTE — Progress Notes (Signed)
    Daily Group Progress Note  Program: CD-IOP   Group Time: 1-2:30 pm  Participation Level: Active  Behavioral Response: Appropriate  Type of Therapy: Process Group  Topic: Process: first half of group was spent in process. Members shared about current difficulties and concerns. The two new group members met scheduled to meet with the medical director in individual sessions during group today. The meaning of "acceptance" was thrown into the mix with members asked to discuss what that means to them.  Group Time: 2:45- 4pm  Participation Level: Active  Behavioral Response: Sharing  Type of Therapy: Psycho-education Group  Topic:Family Sculpture: the second half of group was spent with members "sculpting" their families. This exercise revealed facets of members' early life that were unknown up till now and, in many cases, similarly dysfunctional and frightening.  Members shared openly about their feelings as they viewed their sculpture and there was good feedback and validation provided by their fellow group members.   Summary: The patient reported she feels very comfortable in group and that she is able to open up and share about her life. She reported that the group motivates her to stay clean. She described a crisis of sorts that occurred yesterday. Her dog knocked over her 6 mo son and the infant suffered a concussion. She explained she would spend the weekend building an enclosure so the dog can be outside more of the time. When asked about her recovery, she reported she was going to call a woman she had met at NA and see if they could meet. This woman also has a child. The patient shared that her father had sent her a text and she is hoping that they can renew their relationship. She met with the medical director as the second half of group began and rejoined the group and observed the [facility]. The patient seems to be receptive and doing well in this first week of  group.  Family Program: Family present? No   Name of family member(s):   UDS collected: Yes Results:   AA/NA attended?: [place]  Sponsor?: No   Josiyah Tozzi, LCAS

## 2011-09-20 LAB — PRESCRIPTION ABUSE MONITORING 17P, URINE
Benzodiazepine Screen, Urine: NEGATIVE ng/mL
Cannabinoid Scrn, Ur: NEGATIVE ng/mL
Cocaine Metabolites: NEGATIVE ng/mL
Creatinine, Urine: 54.48 mg/dL (ref >20.0)
Fentanyl, Ur: NEGATIVE ng/mL
MDMA URINE: NEGATIVE ng/mL
Meperidine, Ur: NEGATIVE ng/mL
Methadone Screen, Urine: NEGATIVE ng/mL
Tapentadol, urine: NEGATIVE ng/mL
Tramadol Scrn, Ur: NEGATIVE ng/mL
Zolpidem, Urine: NEGATIVE ng/mL

## 2011-09-20 NOTE — Progress Notes (Signed)
    Daily Group Progress Note  Program: CD-IOP   Group Time: 1-2:30 pm  Participation Level: Minimal  Behavioral Response: Appropriate  Type of Therapy: Psycho-education Group  Topic: Family Sculpture continued: First half of group included other members sharing their family through sculpture. Some members had sculpted their families on Friday, and I asked that the remaining group members engage in this exercise as well. There were a variety of themes present, but the majority of them include emotional abuse and disharmony within the family unit. There was good disclosure among members and feedback provided by the audience observing the sculptures.   Group Time: 2:45- 4pm  Participation Level: Minimal  Behavioral Response: Sharing  Type of Therapy: Process Group  Topic: Process, Introduction, and Graduation: second half of group was spent in process. There were 2 new group members present and they were invited to introduce themselves and describe what they need through this program. As the session neared ending, a graduation ceremony was held for a successfully graduating member. Two former group members arrived to share in the ceremony. There were kind and supportive words shared among the group and the departing member shared emotional feelings about leaving and his appreciation to the group.   Summary: The patient reported she had had a good weekend and almost completed the project of building a small enclosure for her dog outside. She noted she had gone to church and spent time with her son and grandmother and S/O. She had not attended any meetings over the weekend, though. The patient shared about her Bipolar Disorder after the new member reported she was Bipolar. Malory explained that she gets more manic than depressed and it causes her a lot of problems. She agreed that sometimes she has stopped taking her medication because she has felt so flat. The patient provided good feedback  and noted that the graduating member had been very welcoming to her when she first arrived and that he had sent her a text that made her feel like the group cared and she had returned for the second session because of it. She remains alcohol and drug-free.   Family Program: Family present? No   Name of family member(s):   UDS collected: Yes Results: negative  AA/NA attended?: No  Sponsor?: No   Mathilde Mcwherter, LCAS

## 2011-09-21 ENCOUNTER — Other Ambulatory Visit (HOSPITAL_COMMUNITY): Payer: Managed Care, Other (non HMO) | Admitting: Psychology

## 2011-09-22 NOTE — Progress Notes (Signed)
    Daily Group Progress Note  Program: CD-IOP   Group Time: 1-2:30 pm  Participation Level: Active  Behavioral Response: Appropriate  Type of Therapy: Psycho-education Group  Topic: "Be Smart, Not Strong": First half of group included psycho-education on the power of the addiction over the power of one's thinking. Chemically dependent people cannot expect their will power to keep them drug-free and it is imperative that they recognize high risk situations and avoid them. The handout provided was from the [facility]. It included identifying where one rates themselves on 11 different relapse prevention strategies. The totals were identified by each member and each group member identified where they needed to improve and strengthen their recovery strategies.  Group Time: 2:45- 4pm  Participation Level: Active  Behavioral Response: Sharing  Type of Therapy: Process Group  Topic: People on My Side of Recovery/Process; second half of group spent in process. Members shared their current difficulties and struggles in early recovery. at one point, members were asked to list the professionals, family members, friends, support groups, sponsor, and others who would support them in maintaining sobriety. The numbers listed varied widely among the group with those having longer periods of sobriety identifying more people.   Summary: The patient checked-in and continues to make progress in sobriety. She did not have a particularly high score with the relapse strategies, but this is her first treatment experience and she has been in group for 5 sessions. In the second half of group, she noted on her list that she is attending the NA meeting at 10 am on group days and has enjoyed them. She admitted that she hasn't met many women her age and there are mostly younger guys in the group. The patient reported she does attend church with her S/O and noted that almost everyone in the church is related to him  and his family. She reported yesterday was her day with her son and they had a great time together. He is very active and wears her out, but the group agreed that these periods can be addressed through naps and rest. She appears very comfortable and shares openly about her own issues in the group.   Family Program: Family present? No   Name of family member(s):   UDS collected: No Results:   AA/NA attended?: YesMonday, Wednesday and Friday  Sponsor?: No   Remingtyn Depaola, LCAS

## 2011-09-23 ENCOUNTER — Other Ambulatory Visit (HOSPITAL_COMMUNITY): Payer: Managed Care, Other (non HMO) | Admitting: Psychology

## 2011-09-25 NOTE — Progress Notes (Signed)
    Daily Group Progress Note  Program: CD-IOP   Group Time: 1-2:30 pm  Participation Level: Active  Behavioral Response: Sharing  Type of Therapy: Process Group  Topic: Group Process and Introductions: first half of group was spent in process. There were 2 new members in group today, and they introduced themselves during this part of group. One of the new members questioned whether she was actually an alcoholic or just an abuser. This led to a discussion, including a review of the 7 different criteria for CD as listed in the DSM-IV. There was good disclosure and an active engagement among the members.  Group Time: 2:45- 4pm  Participation Level: Active  Behavioral Response: Appropriate and Sharing  Type of Therapy: Psycho-education Group  Topic: Pros and Cons: second half of group spent identifying the pros and cons of using versus sobriety. Each member was able to identify numerous negatives of active drug use as well as pros and cons of sobriety, but there were very few pros noted for using. I pointed out that this seemed odd since so many of the group members had continued to use despite many negative consequences. The general consensus was that the benefit of using was that it served to "numb" the user. The conversation was lively and  members shared openly about their own alcohol and drug use.   Summary: The patient reported she had attended a meeting with another group member and really enjoyed it. She was able to meet younger people, especially women, and this was something she needed. She reported her father was coming into town this weekend and she was hoping that they would have a nice visit and he would be more open and receptive to her. She reported she planned on going to a meeting this weekend and would also attend church on Sunday. The patient appears to be doing well and her most recent drug test was negative on all counts. She is warming up to the group and seems very  relaxed in disclosing her own issues.  Family Program: Family present? No   Name of family member(s):   UDS collected: No Results:   AA/NA attended?: YesMonday, Wednesday, Thursday and Friday  Sponsor?: No   Rayna Brenner, LCAS

## 2011-09-26 ENCOUNTER — Other Ambulatory Visit (HOSPITAL_COMMUNITY): Payer: Managed Care, Other (non HMO) | Admitting: Psychology

## 2011-09-27 NOTE — Progress Notes (Signed)
    Daily Group Progress Note  Program: CD-IOP   Group Time: 1-2:30 pm  Participation Level: Minimal  Behavioral Response: Appropriate  Type of Therapy: Psycho-education Group  Topic: Pharmacist: the first half of group was spent with a presentation by one of the [facility] staff pharmacists, Peggye Fothergill. The guest speaker reviewed the major categories of drugs and how they effect the brain. She also shared about withdrawal symptoms typical to each category. There was good discussion and disclosure with members either sharing their own experiences or describing how their experiences were congruent with her descriptions.   Group Time: 2:45-4 pm  Participation Level: Active  Behavioral Response: Sharing  Type of Therapy: Process Group  Topic: Pharmacist: the first half of group was spent with a presentation by one of the [facility] staff pharmacists, Peggye Fothergill. The guest speaker reviewed the major categories of drugs and how they effect the brain. She also shared about withdrawal symptoms typical to each category. There was good discussion and disclosure with members either sharing their own experiences or describing how their experiences were congruent with her descriptions.   Summary:Pharmacist: the first half of group was spent with a presentation by one of the [facility] staff pharmacists, Peggye Fothergill. The guest speaker reviewed the major categories of drugs and how they effect the brain. She also shared about withdrawal symptoms typical to each category. There was good discussion and disclosure with members either sharing their own experiences or describing how their experiences were congruent with her descriptions.    Family Program: Family present? No   Name of family member(s):   UDS collected: No Results:  AA/NA attended?: YesFriday  Sponsor?: No   Lisaann Atha, LCAS

## 2011-09-28 ENCOUNTER — Other Ambulatory Visit (HOSPITAL_COMMUNITY): Payer: Managed Care, Other (non HMO) | Admitting: Psychology

## 2011-09-29 NOTE — Progress Notes (Signed)
    Daily Group Progress Note  Program: CD-IOP   Group Time: 1-2:30 pm  Participation Level: Active  Behavioral Response: Appropriate  Type of Therapy: Psycho-education Group  Topic:Your Medication is Like Oxygen; Everything in Your Recovery Matters: first half of session spent discussing the importance of every aspect of one's recovery. A former member had returned and shared that he had not taken the time to pick up his medication refills and how, after 2 days had passed, he began to fall apart and, subsequently, relapsed. This disclosure generated the importance of every aspect of one's recovery. I asked members to share what they had eaten today - it was almost 2:30 pm, and very few of the 9 members present had eaten well. A third had eaten acceptably, a third had eaten poorly and a third had eaten nothing. Emphasis was placed the importance of balance and all aspects of good health in recovery.   Group Time: 2:45- 4pm  Participation Level: Active  Behavioral Response: Sharing  Type of Therapy: Process Group  Topic: Group Process: second half of group was spent in process. Members were asked what they were feeling at this moment, but most were unable to answer in a way that suggested they knew what I was asking. There was some disclosure about current issues and, after reading the daily reminder, members were asked what they were grateful for today. There was good disclosure and sharing among the group.  Summary:The patient remains abstinent as indicated by her sobriety date. She reported she was doing well. She agreed with the importance of balance and reminded the group she is a diabetic and also suffers from Bipolar disorder. She works hard to be very compliant with her health requirements. In process, the patient reported she is very grateful for her son and her S/O's continued confidence and support towards her effort.    Family Program: Family present? No   Name of family  member(s):   UDS collected: No Results:  AA/NA attended?: YesTuesday and Wednesday  Sponsor?: No   Amena Dockham, LCAS

## 2011-09-30 ENCOUNTER — Other Ambulatory Visit (HOSPITAL_COMMUNITY): Payer: Managed Care, Other (non HMO) | Attending: Psychiatry

## 2011-09-30 DIAGNOSIS — F429 Obsessive-compulsive disorder, unspecified: Secondary | ICD-10-CM | POA: Insufficient documentation

## 2011-09-30 DIAGNOSIS — F1994 Other psychoactive substance use, unspecified with psychoactive substance-induced mood disorder: Secondary | ICD-10-CM | POA: Insufficient documentation

## 2011-09-30 DIAGNOSIS — F3160 Bipolar disorder, current episode mixed, unspecified: Secondary | ICD-10-CM | POA: Insufficient documentation

## 2011-09-30 DIAGNOSIS — E109 Type 1 diabetes mellitus without complications: Secondary | ICD-10-CM | POA: Insufficient documentation

## 2011-09-30 DIAGNOSIS — F1120 Opioid dependence, uncomplicated: Secondary | ICD-10-CM | POA: Insufficient documentation

## 2011-09-30 DIAGNOSIS — F411 Generalized anxiety disorder: Secondary | ICD-10-CM | POA: Insufficient documentation

## 2011-10-03 ENCOUNTER — Other Ambulatory Visit (HOSPITAL_COMMUNITY): Payer: Managed Care, Other (non HMO) | Admitting: Psychology

## 2011-10-03 DIAGNOSIS — F1920 Other psychoactive substance dependence, uncomplicated: Secondary | ICD-10-CM

## 2011-10-04 LAB — PRESCRIPTION ABUSE MONITORING 17P, URINE
Cocaine Metabolites: NEGATIVE ng/mL
Creatinine, Urine: 17.7 mg/dL — ABNORMAL LOW (ref >20.0)
MDMA URINE: NEGATIVE ng/mL
Methadone Screen, Urine: NEGATIVE ng/mL
Oxycodone Screen, Ur: NEGATIVE ng/mL
Propoxyphene: NEGATIVE ng/mL
Tramadol Scrn, Ur: NEGATIVE ng/mL
Zolpidem, Urine: NEGATIVE ng/mL

## 2011-10-04 NOTE — Progress Notes (Signed)
    Daily Group Progress Note  Program: CD-IOP   Group Time: 1-2:30 pm  Participation Level: Active  Behavioral Response: Appropriate  Type of Therapy: Psycho-education Group  Topic: Feelings; what are you feeling and where do those feelings come from? Upon completion of the check-in, the first half of group spent in psycho education group. Members were provided with a handout known as "The Feeling Chart". After a few minutes, members were asked to identify how they were feeling? Some struggled with this assignment, but most members could relate to the pictures and shared with the group. Another handout was provided that identified why we feel the way we do? There was good discussion and members were able to identify the 1-2 key feelings, primary negative, that they continually struggle with. There was good disclosure among the group.  Group Time: 2:45- 4pm  Participation Level: Active  Behavioral Response: Sharing  Type of Therapy: Process Group  Topic: Process/Guided Relaxation Exercise: the second half of group was spent in process. Members shared their current struggles and issues in early recovery. Halfway through the session, a guided meditation was provided. Of the 8 members, 6 found the guided relaxation very relaxing and helpful. The 2 youngest members admitted they had not found it helpful, but 1 member admitted she had never tried this before. The group agreed that they would like to practice this sort of thing and would welcome a CD that provided this guided meditation. The session proved effective in identifying feelings, the origin of those feelings and how group members can begin to self-soothe in healthy loving ways, including guided relaxation exercises.  Summary: The patient apologized for her absence on Friday, but explained that her son had been sick. She noted she got his cold while he is feelings better now. The patient described her feelings as primarily anxious. She  stated she had a fairly good visit with her father over the weekend. It is clear that this young woman is starving for her father's validation, but rarely gets anything close to that. She explained a little more about her parent's divorce to the group and the way it caused her family to fall apart. The patient admitted she had not found the guided relaxation at all helpful, but she also stated, when questioned, that she had never done that before. She agreed she would be willing to try it again. She remains abstinent, but lacks support for her recovery.   Family Program: Family present? No   Name of family member(s):   UDS collected: Yes Results: not available yet  AA/NA attended?: No  Sponsor?: No   Sabirin Baray, LCAS

## 2011-10-05 ENCOUNTER — Other Ambulatory Visit (HOSPITAL_COMMUNITY): Payer: Managed Care, Other (non HMO)

## 2011-10-07 ENCOUNTER — Other Ambulatory Visit (HOSPITAL_COMMUNITY): Payer: Managed Care, Other (non HMO) | Admitting: Psychology

## 2011-10-07 ENCOUNTER — Encounter (HOSPITAL_COMMUNITY): Payer: Self-pay | Admitting: Psychology

## 2011-10-10 ENCOUNTER — Other Ambulatory Visit (HOSPITAL_COMMUNITY): Payer: Managed Care, Other (non HMO) | Admitting: Psychology
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
## 2011-10-12 ENCOUNTER — Other Ambulatory Visit (HOSPITAL_COMMUNITY): Payer: Managed Care, Other (non HMO)

## 2011-10-14 ENCOUNTER — Other Ambulatory Visit (HOSPITAL_COMMUNITY): Payer: Managed Care, Other (non HMO) | Admitting: Psychology

## 2011-10-14 MED ORDER — TRAZODONE HCL 100 MG PO TABS: 100.0000 mg | ORAL_TABLET | Freq: Every day | ORAL | Status: DC

## 2011-10-16 NOTE — Progress Notes (Signed)
Daily Group Progress Note  Program: CD-IOP   Group Time: 1-2:30 pm  Participation Level: Minimal  Behavioral Response: Sharing  Type of Therapy: Process Group  Topic: Process: first part of group was spent in process. A new member had returned after a 3 session absence. Upon check-in, she disclosed she had not drank since July 30th, but admitted she had smoked marijuana this morning. She insisted that although the alcohol was a problem, she did not feel that the marijuana was a problem and it was very effective in helping her with anxiety and panic attacks. This disclosure invited a lengthy discussion about the problems that result when an addict uses any chemical. A number of other group members shared that they had tried to eliminate some drug use, but continue to use the marijuana, but it never worked for long and they were always back to their old ways within a short time. The session was very effective in allowing members to witness 'denial' and address their own issues around the future and what they will be able to do about their sobriety.  Group Time: 2:45- 4pm  Participation Level: Active  Behavioral Response: Sharing and Resistant  Type of Therapy: Psycho-education Group  Topic: The Wheel of Life/Graduation: the second part of group was spent with members sharing their wheel of life drawing on the board and explaining where they are in each of the 8 categories. Most wheels were extremely uneven and would have led to very bumpy ride. This exercise required that members share about their lives and opened them up to a new level of understanding within the group. As the session neared an end, a graduation ceremony was held for one of the younger members who was moving onto a new level of recovery. members' shared kind words and encouragement to her and she handed out drawings she had made specifically for each member. It was a joyful celebration, but one that also recognized that  her absence will be felt by all.   Summary: the patient remains abstinent with a sobriety date of June 22nd. She reminded the group that this would be her last day because she has begun school at [employer] and has biology labs every afternoon of the week. 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 The patient drew her wheel on the board and is was extremely stressful with little support. She has a grandmother who she is caring for with serious dementia, a 5 month old baby boy, and very little help, along with going to school and being in early recovery. The group was very adamant about her trying to make some changes, but she insisted that she does her best when she is under stress. I noted that stress is the last thing a diabetic needs (she has an insulin pump at age 21). During the graduating she spoke kindly to the member who was completing the program and talked how she had become a real friend to her. The patient has only attended 13 sessions, but is leaving because of the semester starting. We would like her to remain actively engaged in a recovery program, but she has many responsibilities and stressors and has chosen to leave our program in order to fulfill other obligations.    Family Program: Family present? No   Name of family member(s):   UDS collected: No Results:   AA/NA attended?: YesTuesday, Wednesday and Friday  Sponsor?: No   Jigar Zielke, LCAS

## 2011-10-17 ENCOUNTER — Other Ambulatory Visit (HOSPITAL_COMMUNITY): Payer: Managed Care, Other (non HMO)

## 2011-10-19 ENCOUNTER — Other Ambulatory Visit (HOSPITAL_COMMUNITY): Payer: Managed Care, Other (non HMO)

## 2011-10-21 ENCOUNTER — Other Ambulatory Visit (HOSPITAL_COMMUNITY): Payer: Managed Care, Other (non HMO)

## 2011-10-24 ENCOUNTER — Other Ambulatory Visit (HOSPITAL_COMMUNITY): Payer: Managed Care, Other (non HMO)

## 2011-10-26 ENCOUNTER — Other Ambulatory Visit (HOSPITAL_COMMUNITY): Payer: Managed Care, Other (non HMO)

## 2011-10-28 ENCOUNTER — Other Ambulatory Visit (HOSPITAL_COMMUNITY): Payer: Managed Care, Other (non HMO)

## 2011-11-02 ENCOUNTER — Other Ambulatory Visit (HOSPITAL_COMMUNITY): Payer: Managed Care, Other (non HMO) | Attending: Psychiatry

## 2011-11-04 ENCOUNTER — Other Ambulatory Visit (HOSPITAL_COMMUNITY): Payer: Managed Care, Other (non HMO)

## 2011-12-28 ENCOUNTER — Ambulatory Visit (INDEPENDENT_AMBULATORY_CARE_PROVIDER_SITE_OTHER): Payer: Managed Care, Other (non HMO) | Admitting: Physician Assistant

## 2011-12-28 DIAGNOSIS — F319 Bipolar disorder, unspecified: Secondary | ICD-10-CM

## 2011-12-28 MED ORDER — BUSPIRONE HCL 7.5 MG PO TABS: 7.5000 mg | ORAL_TABLET | Freq: Two times a day (BID) | ORAL | Status: DC

## 2011-12-28 MED ORDER — MIRTAZAPINE 45 MG PO TABS: 45.0000 mg | ORAL_TABLET | Freq: Every day | ORAL | Status: DC

## 2011-12-28 MED ORDER — LAMOTRIGINE 100 MG PO TABS: 100.0000 mg | ORAL_TABLET | Freq: Two times a day (BID) | ORAL | Status: DC

## 2011-12-28 NOTE — Progress Notes (Signed)
   [facility] Behavioral Health Follow-up Outpatient Visit  KAE WHARFF 1990/10/16  Date: 12/28/2011   Subjective: Ivone presents today to followup on her treatment for bipolar disorder. This is her first time back since she graduated from the [facility]. She happily reports that she is sober just over 4 months. She continues to attend individual therapy at [employer], and she tends the [facility] group in [place]. She does not feel as connected to that group as the group she attended in [place]. In therapy she has been focusing on how to handle her emotions. She reports that her mood has been "all over the place." She has been "stressed and angry" and attributes that to her schoolwork. She reports that her anxiety has been "through the roof." She denies any depression. She reports that her sleep is okay, except when Viviann Spare is away she feels insecure. She reports that since leaving group she has had multiple family members with medical problems, and now her grandmother has a caregiver at home at all times. She denies any suicidal or homicidal ideation. She denies any auditory or visual hallucinations.  There were no vitals filed for this visit.  Mental Status Examination  Appearance: Well groomed and casually dressed Alert: Yes Attention: good  Cooperative: Yes Eye Contact: Good Speech: Clear and coherent Psychomotor Activity: Normal Memory/Concentration: Intact Oriented: person, place, time/date and situation Mood: Anxious Affect: Congruent Thought Processes and Associations: Linear Fund of Knowledge: Good Thought Content: Normal Insight: Good Judgement: Good  Diagnosis: Bipolar disorder not otherwise specified  Treatment Plan: We will add BuSpar 7.5 mg twice daily to target her anxiety. She has been told to be vigilant for any signs of mania. We will continue her Lamictal 100 mg twice daily and Remeron 45 mg at bedtime. She will return  for followup in 2 months. She was asked to call this office in 2-3 weeks to report on her response to the BuSpar.  Deontray Hunnicutt, PA-C

## 2012-01-12 ENCOUNTER — Other Ambulatory Visit (HOSPITAL_COMMUNITY): Payer: Self-pay | Admitting: *Deleted

## 2012-01-12 NOTE — Telephone Encounter (Signed)
See phone note

## 2012-01-13 MED ORDER — BUSPIRONE HCL 7.5 MG PO TABS: 7.5000 mg | ORAL_TABLET | Freq: Two times a day (BID) | ORAL | Status: DC

## 2012-02-24 ENCOUNTER — Other Ambulatory Visit (HOSPITAL_COMMUNITY): Payer: Self-pay | Admitting: Physician Assistant

## 2012-03-01 ENCOUNTER — Ambulatory Visit (HOSPITAL_COMMUNITY): Payer: Self-pay | Admitting: Physician Assistant

## 2012-03-01 ENCOUNTER — Encounter (HOSPITAL_COMMUNITY): Payer: Self-pay

## 2012-03-07 ENCOUNTER — Telehealth (HOSPITAL_COMMUNITY): Payer: Self-pay

## 2012-03-08 ENCOUNTER — Ambulatory Visit (INDEPENDENT_AMBULATORY_CARE_PROVIDER_SITE_OTHER): Payer: Managed Care, Other (non HMO) | Admitting: Physician Assistant

## 2012-03-08 DIAGNOSIS — F319 Bipolar disorder, unspecified: Secondary | ICD-10-CM

## 2012-03-08 NOTE — Progress Notes (Signed)
   [facility] Behavioral Health Follow-up Outpatient Visit  Katherine Ray 1990-09-03  Date: 03/08/2012   Subjective: Katherine Ray presents today to followup on her treatment for bipolar disorder and substance dependence. She happily reports that she has been 6 months clean as of December 22. She reports that her mood has been stable, although her stress level has increased recently. She finished last semester with a 4.0 average, and started a new semester today. She continues on a nursing track, and is studying statistics, anatomy and physiology, and psychology this semester. 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 Her grandmother is doing okay, but she fell 3 times during the holiday season. She now has additional caregivers coming into the house. She also reports that her mother is getting remarried. She denies any suicidal or homicidal ideation. She denies any auditory or visual hallucinations.  There were no vitals filed for this visit.  Mental Status Examination  Appearance: Well groomed and casually dressed Alert: Yes Attention: good  Cooperative: Yes Eye Contact: Good Speech: Clear and coherent Psychomotor Activity: Normal Memory/Concentration: Intact Oriented: person, place, time/date and situation Mood: Euthymic Affect: Appropriate Thought Processes and Associations: Linear Fund of Knowledge: Good Thought Content: Normal Insight: Good Judgement: Good  Diagnosis: Bipolar disorder NOS, substance dependence in remission  Treatment Plan: We will continue her BuSpar 7.5 mg twice daily, Lamictal 100 mg twice daily, and Remeron 45 mg at bedtime. She is allowed to decrease the dose of Remeron if she can sleep well on a lower dose. She will return for followup in 3 months.  Fenna Semel, PA-C

## 2012-05-25 ENCOUNTER — Other Ambulatory Visit: Payer: Self-pay | Admitting: Physician Assistant

## 2012-05-25 ENCOUNTER — Other Ambulatory Visit (HOSPITAL_COMMUNITY)
Admission: RE | Admit: 2012-05-25 | Discharge: 2012-05-25 | Disposition: A | Discharge status: Home / Self Care | Payer: Self-pay | Source: Ambulatory Visit | Attending: Family Medicine | Admitting: Family Medicine

## 2012-05-25 DIAGNOSIS — Z124 Encounter for screening for malignant neoplasm of cervix: Secondary | ICD-10-CM | POA: Insufficient documentation

## 2012-06-06 ENCOUNTER — Ambulatory Visit (INDEPENDENT_AMBULATORY_CARE_PROVIDER_SITE_OTHER): Payer: 59 | Admitting: Physician Assistant

## 2012-06-06 DIAGNOSIS — F1920 Other psychoactive substance dependence, uncomplicated: Secondary | ICD-10-CM

## 2012-06-06 DIAGNOSIS — F319 Bipolar disorder, unspecified: Secondary | ICD-10-CM

## 2012-06-06 MED ORDER — LAMOTRIGINE 100 MG PO TABS: 100.0000 mg | ORAL_TABLET | Freq: Two times a day (BID) | ORAL | Status: DC

## 2012-06-06 MED ORDER — BUSPIRONE HCL 7.5 MG PO TABS: 7.5000 mg | ORAL_TABLET | Freq: Two times a day (BID) | ORAL | Status: DC
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
## 2012-09-05 ENCOUNTER — Ambulatory Visit (INDEPENDENT_AMBULATORY_CARE_PROVIDER_SITE_OTHER): Payer: 59 | Admitting: Physician Assistant

## 2012-09-05 ENCOUNTER — Encounter (HOSPITAL_COMMUNITY): Payer: Self-pay | Admitting: Physician Assistant

## 2012-09-05 VITALS — BP 131/90 | HR 100 | Ht 64.0 in | Wt 129.0 lb

## 2012-09-05 DIAGNOSIS — F3110 Bipolar disorder, current episode manic without psychotic features, unspecified: Secondary | ICD-10-CM

## 2012-09-05 DIAGNOSIS — F1121 Opioid dependence, in remission: Secondary | ICD-10-CM

## 2012-09-05 DIAGNOSIS — F319 Bipolar disorder, unspecified: Secondary | ICD-10-CM

## 2012-09-05 DIAGNOSIS — F1920 Other psychoactive substance dependence, uncomplicated: Secondary | ICD-10-CM

## 2012-09-05 MED ORDER — ARIPIPRAZOLE 5 MG PO TABS: 5.0000 mg | ORAL_TABLET | Freq: Every day | ORAL | Status: DC

## 2012-09-05 MED ORDER — BUSPIRONE HCL 7.5 MG PO TABS: 7.5000 mg | ORAL_TABLET | Freq: Two times a day (BID) | ORAL | Status: DC

## 2012-09-05 MED ORDER — LAMOTRIGINE 100 MG PO TABS: 100.0000 mg | ORAL_TABLET | Freq: Two times a day (BID) | ORAL | Status: DC

## 2012-09-05 NOTE — Progress Notes (Signed)
[facility] [address] Progress Note  Katherine Ray [id number] 22 y.o.  09/05/2012 3:39 PM  Chief Complaint: Mood instability and increased irritability  History of Present Illness: Katherine Ray presents today to followup on her treatment for bipolar disorder and history of drug dependence. She reports that her life has been hectic recently. She states that her mood has been "very unstable," and she endorses an increase in irritability. She denies that she has been depressed. She reports that she sleeps well except that her grandmother, who has advancing dementia, wakes her up in the middle of the night very confused. She cites stressors recently of her father's girlfriend moving in, and her father moving some of her belongings into the home. 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 She happily reports that she was clean for one year at the end of June.  Suicidal Ideation: No Plan Formed: No Patient has means to carry out plan: No  Homicidal Ideation: No Plan Formed: No Patient has means to carry out plan: No  Review of Systems: Psychiatric: Agitation: Yes Hallucination: No Depressed Mood: No Insomnia: No Hypersomnia: No Altered Concentration: No Feels Worthless: No Grandiose Ideas: No Belief In Special Powers: No New/Increased Substance Abuse: No Compulsions: No  Neurologic: Headache: No Seizure: No Paresthesias: No  Past Medical History: Diabetes mellitus type 1, asthma  Outpatient Encounter Prescriptions as of 09/05/2012  Medication Sig Dispense Refill  . ARIPiprazole (ABILIFY) 5 MG tablet Take 1 tablet (5 mg total) by mouth daily.  30 tablet  1  . busPIRone (BUSPAR) 7.5 MG tablet Take 1 tablet (7.5 mg total) by mouth 2 (two) times daily.  180 tablet  0  . insulin aspart (NOVOLOG) 100 UNIT/ML injection Inject into the skin 3 (three)  times daily before meals.      . insulin glargine (LANTUS) 100 UNIT/ML injection Inject 29 Units into the skin daily.      Marland Kitchen lamoTRIgine (LAMICTAL) 100 MG tablet Take 1 tablet (100 mg total) by mouth 2 (two) times daily.  180 tablet  0  . mirtazapine (REMERON) 45 MG tablet Take 1 tablet (45 mg total) by mouth at bedtime.  90 tablet  0  . [DISCONTINUED] busPIRone (BUSPAR) 7.5 MG tablet Take 1 tablet (7.5 mg total) by mouth 2 (two) times daily.  180 tablet  0  . [DISCONTINUED] lamoTRIgine (LAMICTAL) 100 MG tablet Take 1 tablet (100 mg total) by mouth 2 (two) times daily.  180 tablet  0  . [DISCONTINUED] mirtazapine (REMERON) 45 MG tablet Take 1 tablet (45 mg total) by mouth at bedtime.  90 tablet  0   No facility-administered encounter medications on file as of 09/05/2012.    Past Psychiatric History/Hospitalization(s): Anxiety: Yes Bipolar Disorder: Yes Depression: Yes Mania: Yes Psychosis: No Schizophrenia: No Personality Disorder: No Hospitalization for psychiatric illness: Yes History of Electroconvulsive Shock Therapy: No Prior Suicide Attempts: No  Physical Exam: Constitutional:  BP 131/90  Pulse 100  Ht 5\' 4"  (1.626 m)  Wt 129 lb (58.514 kg)  BMI 22.13 kg/m2  General Appearance: alert, oriented, no acute distress, well nourished and casually dressed  Musculoskeletal: Strength & Muscle Tone: within normal limits Gait & Station: normal Patient leans: N/A  Psychiatric: Speech (describe rate, volume, coherence, spontaneity, and abnormalities if any): Clear and coherent add a regular rate and rhythm and normal volume  Thought Process (  describe rate, content, abstract reasoning, and computation): Within normal limits  Associations: Intact  Thoughts: normal  Mental Status: Orientation: oriented to person, place, time/date and situation Mood & Affect: anxiety and congruent affect Attention Span & Concentration: Intact  Medical Decision Making (Choose Three): Review of  Psycho-Social Stressors (1), Established Problem, Worsening (2), Review of Medication Regimen & Side Effects (2) and Review of New Medication or Change in Dosage (2)  Assessment: Axis I: Bipolar disorder, currently manic; opiate dependence, in remission  Axis II: Deferred  Axis III: Diabetes mellitus type 1, asthma  Axis IV: Moderate to severe  Axis V: 60   Plan: We'll continue her Lamictal 100 mg twice daily, and try her on Abilify, initially 2.5 mg for 2 weeks, then increase to 5 mg if significant improvement is not experienced. She will return for followup in 2 months.  Katherine Heckmann, PA-C 09/05/2012

## 2012-11-01 ENCOUNTER — Other Ambulatory Visit (HOSPITAL_COMMUNITY): Payer: Self-pay | Admitting: Physician Assistant

## 2012-11-08 ENCOUNTER — Encounter (HOSPITAL_COMMUNITY): Payer: Self-pay | Admitting: Physician Assistant

## 2012-11-08 ENCOUNTER — Ambulatory Visit (INDEPENDENT_AMBULATORY_CARE_PROVIDER_SITE_OTHER): Payer: 59 | Admitting: Physician Assistant

## 2012-11-08 VITALS — BP 139/87 | HR 95 | Ht 64.0 in | Wt 129.2 lb

## 2012-11-08 DIAGNOSIS — F319 Bipolar disorder, unspecified: Secondary | ICD-10-CM

## 2012-11-08 MED ORDER — ARIPIPRAZOLE 5 MG PO TABS: ORAL_TABLET | ORAL | Status: AC

## 2012-11-08 MED ORDER — LAMOTRIGINE 100 MG PO TABS: 100.0000 mg | ORAL_TABLET | Freq: Two times a day (BID) | ORAL | Status: DC

## 2012-11-08 MED ORDER — GABAPENTIN 300 MG PO CAPS: 300.0000 mg | ORAL_CAPSULE | Freq: Three times a day (TID) | ORAL | Status: DC

## 2012-11-08 NOTE — Progress Notes (Signed)
[facility] [address] Progress Note  Katherine Ray [id number] 22 y.o.  11/08/2012 2:52 PM  Chief Complaint: Increased anxiety  History of Present Illness: Katherine Ray presents today to followup on her treatment for bipolar disorder. At her last appointment approximately 2 months ago we started her on Abilify for her increased irritability. She reports that she did increase the dose to 5 mg as we had discussed, and that her irritability is significantly reduced. She denies any side effects to it, including any increase in appetite or weight gain. She reports that she is not sleeping well because her grandmother, who has dementia, gets up at 2 AM and Sherral must get up to make sure she safe. 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 She reports that she has tremors in the classroom. She is under a lot of stress in other areas of her life as well. She has been working with her therapist to use nonpharmacological methods to treat her anxiety, but they don't seem to be helping. We discussed several medications including Vistaril, Neurontin, beta blockers, as well as increasing the dose of BuSpar.  Suicidal Ideation: No Plan Formed: No Patient has means to carry out plan: No  Homicidal Ideation: No Plan Formed: No Patient has means to carry out plan: No  Review of Systems: Psychiatric: Agitation: No Hallucination: No Depressed Mood: No Insomnia: No Hypersomnia: No Altered Concentration: No Feels Worthless: No Grandiose Ideas: No Belief In Special Powers: No New/Increased Substance Abuse: No Compulsions: No  Neurologic: Headache: No Seizure: No Paresthesias: No  Past Medical History: Diabetes mellitus type 1, asthma  Outpatient Encounter Prescriptions as of 11/08/2012  Medication Sig Dispense Refill  . ARIPiprazole (ABILIFY) 5 MG tablet TAKE 1 TABLET (5 MG TOTAL) BY MOUTH DAILY.  90 tablet  1  . busPIRone (BUSPAR) 7.5 MG  tablet Take 1 tablet (7.5 mg total) by mouth 2 (two) times daily.  180 tablet  0  . gabapentin (NEURONTIN) 300 MG capsule Take 1 capsule (300 mg total) by mouth 3 (three) times daily.  90 capsule  0  . insulin aspart (NOVOLOG) 100 UNIT/ML injection Inject into the skin 3 (three) times daily before meals.      . insulin glargine (LANTUS) 100 UNIT/ML injection Inject 29 Units into the skin daily.      Marland Kitchen lamoTRIgine (LAMICTAL) 100 MG tablet Take 1 tablet (100 mg total) by mouth 2 (two) times daily.  180 tablet  0  . mirtazapine (REMERON) 45 MG tablet Take 1 tablet (45 mg total) by mouth at bedtime.  90 tablet  0  . [DISCONTINUED] ABILIFY 5 MG tablet TAKE 1 TABLET (5 MG TOTAL) BY MOUTH DAILY.  30 tablet  1  . [DISCONTINUED] lamoTRIgine (LAMICTAL) 100 MG tablet Take 1 tablet (100 mg total) by mouth 2 (two) times daily.  180 tablet  0   No facility-administered encounter medications on file as of 11/08/2012.    Past Psychiatric History/Hospitalization(s): Anxiety: Yes Bipolar Disorder: Yes Depression: Yes Mania: Yes Psychosis: No Schizophrenia: No Personality Disorder: No Hospitalization for psychiatric illness: Yes History of Electroconvulsive Shock Therapy: No Prior Suicide Attempts: No  Physical Exam: Constitutional:  BP 139/87  Pulse 95  Ht 5\' 4"  (1.626 m)  Wt 129 lb 3.2 oz (58.605 kg)  BMI 22.17 kg/m2  General Appearance: alert, oriented, no acute distress, well nourished and casual  Musculoskeletal: Strength & Muscle Tone: within normal limits Gait & Station: normal Patient leans: N/A  Psychiatric: Speech (describe rate, volume, coherence, spontaneity, and abnormalities if any): Clear and coherent at a regular rate and rhythm and normal volume  Thought Process (describe rate, content, abstract reasoning, and computation): Within normal limits  Associations: Intact  Thoughts: normal  Mental Status: Orientation: oriented to person, place, time/date and situation Mood &  Affect: anxiety Attention Span & Concentration: Intact  Medical Decision Making (Choose Three): Established Problem, Stable/Improving (1), Review of Psycho-Social Stressors (1), Established Problem, Worsening (2), Review of Medication Regimen & Side Effects (2) and Review of New Medication or Change in Dosage (2)  Assessment: Axis I: Bipolar 1  Axis II: Deferred  Axis III: Diabetes mellitus type 1, asthma  Axis IV: Moderate to severe  Axis V: 60   Plan: We will initiate Neurontin 300 mg 3 times daily to address her anxiety. We will continue Lamictal 100 mg twice daily, Abilify 5 mg daily, and BuSpar 7.5 mg twice daily. She will return in 4 weeks for followup. At that time if her anxiety is improved we may discontinue the BuSpar.  Ashlyne Olenick, PA-C 11/08/2012

## 2012-12-10 ENCOUNTER — Other Ambulatory Visit (HOSPITAL_COMMUNITY): Payer: Self-pay | Admitting: Physician Assistant

## 2012-12-10 DIAGNOSIS — F319 Bipolar disorder, unspecified: Secondary | ICD-10-CM

## 2013-01-03 ENCOUNTER — Ambulatory Visit (INDEPENDENT_AMBULATORY_CARE_PROVIDER_SITE_OTHER): Payer: 59 | Admitting: Psychiatry

## 2013-01-03 ENCOUNTER — Encounter (INDEPENDENT_AMBULATORY_CARE_PROVIDER_SITE_OTHER): Payer: Self-pay

## 2013-01-03 ENCOUNTER — Encounter (HOSPITAL_COMMUNITY): Payer: Self-pay | Admitting: Psychiatry

## 2013-01-03 ENCOUNTER — Ambulatory Visit (HOSPITAL_COMMUNITY): Payer: Self-pay | Admitting: Physician Assistant

## 2013-01-03 VITALS — BP 120/78 | HR 88 | Ht 64.0 in | Wt 128.6 lb

## 2013-01-03 DIAGNOSIS — F319 Bipolar disorder, unspecified: Secondary | ICD-10-CM

## 2013-01-03 MED ORDER — BUSPIRONE HCL 5 MG PO TABS: 5.0000 mg | ORAL_TABLET | Freq: Three times a day (TID) | ORAL | Status: DC

## 2013-01-03 NOTE — Progress Notes (Signed)
[facility] [id number] Progress Note  Katherine Ray [id number] 22 y.o.  01/03/2013 3:37 PM  Chief Complaint: Increased anxiety and followup  History of Present Illness:  Katherine Ray his 22 year old Caucasian female who has been seen by physician assistant in this office for the management of her bipolar disorder and ADD.  Patient has long history of drug and alcohol use.  She was also diagnosed with bipolar disorder.  She was seen last in September 2014 by physician assistant and recommended to increase her Neurontin.  She is taking Neurontin 3 times a day.  However she forgot to take BuSpar .  She is experiencing increased anxiety and nervousness.  She is in nursing school .  She admitted increased stress coming from education in living situation.  She lives with her boyfriend, to a half-year-old son, father and his girlfriend.  The patient endorsed some time irritability anger and mood swing however denies any paranoia or any hallucination.  Patient has diabetes and she see Dr. Leslie Dales for the management of diabetes.  She is taking Abilify for past few months , she has seen improvement in her ability and anger however there are times when she is more anxious and nervous.  She denies any tremors or shakes.  She denies any recent drug use .  I review her medication and noticed that she is taking Vicodin and stimulant however patient mentioned that these are not her medication.  She has a twin sister who has same date of birth.  Patient is not sure if she has taken these medication.  Patient has not been drinking.  She is not using any illegal substance.  She is more focused for her school.  She is sleeping 5-6 hours.  She seeing therapist Orlie Pollen at [facility].     Suicidal Ideation: No Plan Formed: No Patient has means to carry out plan: No  Homicidal Ideation: No Plan Formed: No Patient has means to carry out plan: No  Review of Systems: Psychiatric: Agitation:  No Hallucination: No Depressed Mood: No Insomnia: No Hypersomnia: No Altered Concentration: No Feels Worthless: No Grandiose Ideas: No Belief In Special Powers: No New/Increased Substance Abuse: No Compulsions: No  Neurologic: Headache: No Seizure: No Paresthesias: No  Past Medical History: Diabetes mellitus type 1, asthma.  She Dr. Leslie Dales for the management of diabetes.  She has insulin pump.  Outpatient Encounter Prescriptions as of 01/03/2013  Medication Sig  . ARIPiprazole (ABILIFY) 5 MG tablet TAKE 1 TABLET (5 MG TOTAL) BY MOUTH DAILY.  . busPIRone (BUSPAR) 5 MG tablet Take 1 tablet (5 mg total) by mouth 3 (three) times daily.  Marland Kitchen gabapentin (NEURONTIN) 300 MG capsule TAKE ONE CAPSULE BY MOUTH 3 TIMES A DAY  . insulin aspart (NOVOLOG) 100 UNIT/ML injection Inject into the skin 3 (three) times daily before meals.  . insulin glargine (LANTUS) 100 UNIT/ML injection Inject 29 Units into the skin daily.  Marland Kitchen lamoTRIgine (LAMICTAL) 100 MG tablet Take 1 tablet (100 mg total) by mouth 2 (two) times daily.  Marland Kitchen PATADAY 0.2 % SOLN   . [DISCONTINUED] busPIRone (BUSPAR) 7.5 MG tablet Take 1 tablet (7.5 mg total) by mouth 2 (two) times daily.  . [DISCONTINUED] mirtazapine (REMERON) 45 MG tablet Take 1 tablet (45 mg total) by mouth at bedtime.    Past Psychiatric History/Hospitalization(s): Anxiety: Yes Bipolar Disorder: Yes Depression: Yes Mania: Yes Psychosis: No Schizophrenia: No Personality Disorder: No Hospitalization for psychiatric illness: Yes History of Electroconvulsive Shock Therapy: No Prior Suicide  Attempts: No  Physical Exam: Constitutional:  BP 120/78  Pulse 88  Ht 5\' 4"  (1.626 m)  Wt 128 lb 9.6 oz (58.333 kg)  BMI 22.06 kg/m2  General Appearance: alert, oriented, no acute distress, well nourished and casual  Musculoskeletal: Strength & Muscle Tone: within normal limits Gait & Station: normal Patient leans: N/A  Psychiatric: Speech (describe rate,  volume, coherence, spontaneity, and abnormalities if any): Clear and coherent at a regular rate and rhythm and normal volume  Thought Process (describe rate, content, abstract reasoning, and computation): Within normal limits  Associations: Intact  Thoughts: normal  Mental Status: Orientation: oriented to person, place, time/date and situation Mood & Affect: anxiety Attention Span & Concentration: Intact  Medical Decision Making (Choose Three): Established Problem, Stable/Improving (1), Review of Psycho-Social Stressors (1), Decision to obtain old records (1), Review and summation of old records (2), Established Problem, Worsening (2), Review of Medication Regimen & Side Effects (2) and Review of New Medication or Change in Dosage (2)  Assessment: Axis I: Bipolar 1  Axis II: Deferred  Axis III: Diabetes mellitus type 1, asthma  Axis IV: Moderate to severe  Axis V: 60   Plan:  I review her records in chart.  Recommended to restart BuSpar 5 mg 3 times a day.  She is feeling better since Neurontin has been increased.  I will continue Abilify 5 mg daily.  Due to the history of diabetes we will defer any adjustment or increasing her Abilify.  We will continue her Lamictal 100 mg twice a day.  Patient does not have any side effects including any rash or itching.  We will get records from her endocrinologist including recent blood work.  Followup in 4 week period she has refills remaining on her Abilify , gabapentin and Lamictal. Time spent 25 minutes.  More than 50% of the time spent in psychoeducation, counseling and coordination of care.  Discuss safety plan that anytime having active suicidal thoughts or homicidal thoughts then patient need to call 911 or go to the local emergency room.  Katherine Ray T., MD 01/03/2013

## 2013-01-07 ENCOUNTER — Other Ambulatory Visit (HOSPITAL_COMMUNITY): Payer: Self-pay | Admitting: Physician Assistant

## 2013-01-07 DIAGNOSIS — F3289 Other specified depressive episodes: Secondary | ICD-10-CM

## 2013-01-31 ENCOUNTER — Ambulatory Visit (HOSPITAL_COMMUNITY): Payer: 59 | Admitting: Psychiatry

## 2013-01-31 ENCOUNTER — Encounter (HOSPITAL_COMMUNITY): Payer: Self-pay | Admitting: Psychiatry

## 2013-01-31 DIAGNOSIS — F3289 Other specified depressive episodes: Secondary | ICD-10-CM

## 2013-01-31 DIAGNOSIS — F319 Bipolar disorder, unspecified: Secondary | ICD-10-CM

## 2013-01-31 MED ORDER — GABAPENTIN 300 MG PO CAPS: ORAL_CAPSULE | ORAL | Status: AC

## 2013-01-31 NOTE — Progress Notes (Signed)
[facility] [address] Progress Note  Katherine Ray [id number] 22 y.o.  01/31/2013 3:01 PM  Chief Complaint: Medication management follow up.   History of Present Illness:  Katherine Ray came for follow up appointment.  She is doing better on her current psychotropic medication.  She is taking BuSpar regularly.  She is less anxious and less depressed.  She is sleeping better.  She is complying with the Neurontin, Abilify, BuSpar and Lamictal.  She had a rash.  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  She is not using any illegal substance.  She is more focused for her school.  She seeing therapist Orlie Pollen at [employer] [facility].     Suicidal Ideation: No Plan Formed: No Patient has means to carry out plan: No  Homicidal Ideation: No Plan Formed: No Patient has means to carry out plan: No  Review of Systems: Psychiatric: Agitation: No Hallucination: No Depressed Mood: No Insomnia: No Hypersomnia: No Altered Concentration: No3 Feels Worthless: No Grandiose Ideas: No Belief In Special Powers: No New/Increased Substance Abuse: No Compulsions: No  Neurologic: Headache: No Seizure: No Paresthesias: No  Past Medical History:  Diabetes mellitus type 1, asthma.  She Dr. Leslie Dales for the management of diabetes.  She has insulin pump.  Outpatient Encounter Prescriptions as of 01/31/2013  Medication Sig  . ARIPiprazole (ABILIFY) 5 MG tablet TAKE 1 TABLET (5 MG TOTAL) BY MOUTH DAILY.  . busPIRone (BUSPAR) 5 MG tablet Take 1 tablet (5 mg total) by mouth 3 (three) times daily.  Marland Kitchen gabapentin (NEURONTIN) 300 MG capsule TAKE ONE CAPSULE BY MOUTH 3 TIMES A DAY  . insulin aspart (NOVOLOG) 100 UNIT/ML injection Inject into the skin 3 (three) times daily before meals.  . insulin glargine (LANTUS) 100 UNIT/ML injection Inject 29 Units into the skin daily.  Marland Kitchen lamoTRIgine (LAMICTAL) 100 MG tablet Take 1 tablet (100 mg total) by mouth 2 (two)  times daily.  Marland Kitchen PATADAY 0.2 % SOLN   . [DISCONTINUED] gabapentin (NEURONTIN) 300 MG capsule TAKE ONE CAPSULE BY MOUTH 3 TIMES A DAY    Past Psychiatric History/Hospitalization(s): Anxiety: Yes Bipolar Disorder: Yes Depression: Yes Mania: Yes Psychosis: No Schizophrenia: No Personality Disorder: No Hospitalization for psychiatric illness: Yes History of Electroconvulsive Shock Therapy: No Prior Suicide Attempts: No  Physical Exam: Constitutional:  There were no vitals taken for this visit.  General Appearance: alert, oriented, no acute distress, well nourished and casual  Musculoskeletal: Strength & Muscle Tone: within normal limits Gait & Station: normal Patient leans: N/A  Psychiatric: Speech (describe rate, volume, coherence, spontaneity, and abnormalities if any): Clear and coherent at a regular rate and rhythm and normal volume  Thought Process (describe rate, content, abstract reasoning, and computation): Within normal limits  Associations: Intact  Thoughts: normal  Mental Status: Orientation: oriented to person, place, time/date and situation Mood & Affect: anxiety Attention Span & Concentration: Intact  Medical Decision Making (Choose Three): Established Problem, Stable/Improving (1), Review of Last Therapy Session (1) and Review of New Medication or Change in Dosage (2)  Assessment: Axis I: Bipolar 1  Axis II: Deferred  Axis III: Diabetes mellitus type 1, asthma  Axis IV: Moderate to severe  Axis V: 60   Plan:  continue current medication.  BuSpar 5 mg 3 times a day,  Neurontin 300 mg 3 times a day, Abilify 5 mg daily and Lamictal 100 mg twice a day.  Patient does not have any side effects including  any rash or itching.  Recommend to call us back if she is any question of a concern.  Followup in 2 months.  Ethelbert Thain T., MD 01/31/2013

## 2013-03-07 ENCOUNTER — Other Ambulatory Visit (HOSPITAL_COMMUNITY): Payer: Self-pay | Admitting: Psychiatry

## 2013-03-08 ENCOUNTER — Other Ambulatory Visit (HOSPITAL_COMMUNITY): Payer: Self-pay | Admitting: Psychiatry

## 2013-03-08 NOTE — Telephone Encounter (Signed)
Given new script for 90 days on 01/31/13. Too soon to refill.

## 2013-04-04 ENCOUNTER — Telehealth (HOSPITAL_COMMUNITY): Payer: Self-pay | Admitting: *Deleted

## 2013-04-04 ENCOUNTER — Ambulatory Visit (HOSPITAL_COMMUNITY): Payer: Self-pay | Admitting: Psychiatry

## 2013-04-04 NOTE — Telephone Encounter (Signed)
Father left [id number]:Daughter did not come to appt today.Confusion over time.Has not had medication in a while.Worried about her as diagnosed bipolar and history of hopitalization and past suicide attempts.Please call.  No release of information on file in chart to speak with father  Called as courtesy to acknowledge information about pt would be given to MD. Informed father no release on file. Father states patient has signed release.  Advised father if concerns related to patient safety arise, to go to ED or call 911. Also told father that office recommends patients contact their pharmacy to have refill requests sent to office.

## 2013-04-05 ENCOUNTER — Telehealth (HOSPITAL_COMMUNITY): Payer: Self-pay

## 2013-04-05 ENCOUNTER — Other Ambulatory Visit (HOSPITAL_COMMUNITY): Payer: Self-pay | Admitting: Psychiatry

## 2013-04-05 DIAGNOSIS — F319 Bipolar disorder, unspecified: Secondary | ICD-10-CM

## 2013-04-05 MED ORDER — LAMOTRIGINE 100 MG PO TABS: 100.0000 mg | ORAL_TABLET | Freq: Two times a day (BID) | ORAL | Status: AC

## 2013-04-05 MED ORDER — BUSPIRONE HCL 5 MG PO TABS: 5.0000 mg | ORAL_TABLET | Freq: Three times a day (TID) | ORAL | Status: AC

## 2013-04-05 NOTE — Telephone Encounter (Signed)
Patient missed an appointment and now she is out of her BuSpar and Lamictal.  She scheduled to see on Monday.  I will refill the prescription of Lamictal and BuSpar.  We will address noncompliant issue on appointment.

## 2013-04-15 ENCOUNTER — Ambulatory Visit (HOSPITAL_COMMUNITY): Payer: Self-pay | Admitting: Psychiatry

## 2013-06-27 ENCOUNTER — Other Ambulatory Visit: Payer: Self-pay | Admitting: Obstetrics & Gynecology

## 2013-06-27 ENCOUNTER — Other Ambulatory Visit (HOSPITAL_COMMUNITY)
Admission: RE | Admit: 2013-06-27 | Discharge: 2013-06-27 | Disposition: A | Discharge status: Home / Self Care | Payer: Managed Care, Other (non HMO) | Source: Ambulatory Visit | Attending: Obstetrics & Gynecology | Admitting: Obstetrics & Gynecology

## 2013-06-27 DIAGNOSIS — Z01419 Encounter for gynecological examination (general) (routine) without abnormal findings: Secondary | ICD-10-CM | POA: Insufficient documentation
